# Patient Record
Sex: Male | Born: 1965 | Race: White | Hispanic: No | Marital: Married | Smoking: Never smoker
Health system: Southern US, Community
[De-identification: ages and names within clinical notes are randomized; demographics above are authoritative.]

## PROBLEM LIST (undated history)

## (undated) DIAGNOSIS — I639 Cerebral infarction, unspecified: Secondary | ICD-10-CM

## (undated) DIAGNOSIS — J45990 Exercise induced bronchospasm: Secondary | ICD-10-CM

## (undated) DIAGNOSIS — E785 Hyperlipidemia, unspecified: Secondary | ICD-10-CM

## (undated) DIAGNOSIS — Q183 Webbing of neck: Secondary | ICD-10-CM

## (undated) DIAGNOSIS — R7989 Other specified abnormal findings of blood chemistry: Secondary | ICD-10-CM

## (undated) DIAGNOSIS — M542 Cervicalgia: Secondary | ICD-10-CM

## (undated) DIAGNOSIS — E790 Hyperuricemia without signs of inflammatory arthritis and tophaceous disease: Secondary | ICD-10-CM

## (undated) DIAGNOSIS — N529 Male erectile dysfunction, unspecified: Secondary | ICD-10-CM

## (undated) HISTORY — PX: VASECTOMY: SHX75

## (undated) HISTORY — DX: Other specified abnormal findings of blood chemistry: R79.89

## (undated) HISTORY — PX: SHOULDER SURGERY: SHX246

## (undated) HISTORY — DX: Male erectile dysfunction, unspecified: N52.9

## (undated) HISTORY — DX: Webbing of neck: Q18.3

## (undated) HISTORY — PX: HERNIA REPAIR: SHX51

## (undated) HISTORY — PX: KNEE SURGERY: SHX244

## (undated) HISTORY — DX: Hyperuricemia without signs of inflammatory arthritis and tophaceous disease: E79.0

## (undated) HISTORY — DX: Cerebral infarction, unspecified: I63.9

## (undated) HISTORY — DX: Cervicalgia: M54.2

---

## 2010-11-06 ENCOUNTER — Emergency Department (HOSPITAL_COMMUNITY): Payer: Managed Care, Other (non HMO)

## 2010-11-06 ENCOUNTER — Inpatient Hospital Stay (HOSPITAL_COMMUNITY)
Admission: EM | Admit: 2010-11-06 | Discharge: 2010-11-07 | Disposition: A | Discharge status: Home / Self Care | Payer: Managed Care, Other (non HMO) | Source: Home / Self Care | Attending: Internal Medicine | Admitting: Internal Medicine

## 2010-11-06 ENCOUNTER — Inpatient Hospital Stay (HOSPITAL_COMMUNITY): Payer: Managed Care, Other (non HMO)

## 2010-11-06 DIAGNOSIS — E669 Obesity, unspecified: Secondary | ICD-10-CM | POA: Diagnosis present

## 2010-11-06 DIAGNOSIS — Z7982 Long term (current) use of aspirin: Secondary | ICD-10-CM

## 2010-11-06 DIAGNOSIS — E785 Hyperlipidemia, unspecified: Secondary | ICD-10-CM | POA: Diagnosis present

## 2010-11-06 DIAGNOSIS — N529 Male erectile dysfunction, unspecified: Secondary | ICD-10-CM | POA: Diagnosis present

## 2010-11-06 DIAGNOSIS — I7774 Dissection of vertebral artery: Secondary | ICD-10-CM | POA: Diagnosis present

## 2010-11-06 DIAGNOSIS — I6340 Cerebral infarction due to embolism of unspecified cerebral artery: Secondary | ICD-10-CM | POA: Diagnosis present

## 2010-11-06 DIAGNOSIS — I498 Other specified cardiac arrhythmias: Secondary | ICD-10-CM | POA: Diagnosis present

## 2010-11-06 LAB — URINALYSIS, ROUTINE W REFLEX MICROSCOPIC
Glucose, UA: NEGATIVE mg/dL
Nitrite: NEGATIVE
Protein, ur: NEGATIVE mg/dL
Urobilinogen, UA: 0.2 mg/dL (ref 0.0–1.0)

## 2010-11-06 LAB — BASIC METABOLIC PANEL
BUN: 18 mg/dL (ref 6–23)
Chloride: 102 mEq/L (ref 96–112)
Creatinine, Ser: 1.12 mg/dL (ref 0.4–1.5)
Glucose, Bld: 145 mg/dL — ABNORMAL HIGH (ref 70–99)

## 2010-11-06 LAB — DIFFERENTIAL
Eosinophils Absolute: 0 10*3/uL (ref 0.0–0.7)
Eosinophils Relative: 0 % (ref 0–5)
Lymphs Abs: 0.6 10*3/uL — ABNORMAL LOW (ref 0.7–4.0)
Monocytes Relative: 2 % — ABNORMAL LOW (ref 3–12)
Neutrophils Relative %: 92 % — ABNORMAL HIGH (ref 43–77)

## 2010-11-06 LAB — SEDIMENTATION RATE: Sed Rate: 5 mm/hr (ref 0–16)

## 2010-11-06 LAB — CK TOTAL AND CKMB (NOT AT ARMC)
CK, MB: 1.6 ng/mL (ref 0.3–4.0)
Relative Index: 0.9 (ref 0.0–2.5)

## 2010-11-06 LAB — CBC
HCT: 42.1 % (ref 39.0–52.0)
MCH: 30.8 pg (ref 26.0–34.0)
MCV: 85.9 fL (ref 78.0–100.0)
Platelets: 201 10*3/uL (ref 150–400)
RBC: 4.9 MIL/uL (ref 4.22–5.81)

## 2010-11-06 LAB — TROPONIN I: Troponin I: <0.3 ng/mL (ref <0.30)

## 2010-11-06 LAB — PROTIME-INR: Prothrombin Time: 14.9 seconds (ref 11.6–15.2)

## 2010-11-07 ENCOUNTER — Encounter (HOSPITAL_COMMUNITY): Payer: Self-pay | Admitting: Radiology

## 2010-11-07 ENCOUNTER — Inpatient Hospital Stay (HOSPITAL_COMMUNITY): Payer: Managed Care, Other (non HMO)

## 2010-11-07 ENCOUNTER — Inpatient Hospital Stay (HOSPITAL_COMMUNITY)
Admission: AD | Admit: 2010-11-07 | Discharge: 2010-11-09 | DRG: 064 | Disposition: A | Discharge status: Home / Self Care | Payer: Managed Care, Other (non HMO) | Source: Other Acute Inpatient Hospital | Attending: Internal Medicine | Admitting: Internal Medicine

## 2010-11-07 DIAGNOSIS — G459 Transient cerebral ischemic attack, unspecified: Secondary | ICD-10-CM

## 2010-11-07 HISTORY — DX: Exercise induced bronchospasm: J45.990

## 2010-11-07 HISTORY — DX: Hyperlipidemia, unspecified: E78.5

## 2010-11-07 LAB — BETA-2-GLYCOPROTEIN I ABS, IGG/M/A
Beta-2-Glycoprotein I IgA: 1 A Units (ref <20)
Beta-2-Glycoprotein I IgM: 1 M Units (ref <20)

## 2010-11-07 LAB — LUPUS ANTICOAGULANT PANEL
DRVVT: 48.2 secs — ABNORMAL HIGH (ref 36.2–44.3)
Lupus Anticoagulant: NOT DETECTED
PTTLA 4:1 Mix: 46.1 secs — ABNORMAL HIGH (ref 30.0–45.6)
PTTLA Confirmation: 4 secs (ref <8.0)
dRVVT Incubated 1:1 Mix: 37.7 secs (ref 36.2–44.3)

## 2010-11-07 LAB — LIPID PANEL
Total CHOL/HDL Ratio: 6.1 RATIO
VLDL: 45 mg/dL — ABNORMAL HIGH (ref 0–40)

## 2010-11-07 LAB — ANTITHROMBIN III: AntiThromb III Func: 111 % (ref 76–126)

## 2010-11-07 LAB — PROTEIN S, TOTAL: Protein S Ag, Total: 187 % — ABNORMAL HIGH (ref 60–150)

## 2010-11-07 LAB — ANA: Anti Nuclear Antibody(ANA): NEGATIVE

## 2010-11-07 MED ORDER — IOHEXOL 350 MG/ML SOLN
50.0000 mL | Freq: Once | INTRAVENOUS | Status: AC | PRN
  Administered 2010-11-07: 50 mL via INTRAVENOUS

## 2010-11-08 DIAGNOSIS — I6330 Cerebral infarction due to thrombosis of unspecified cerebral artery: Secondary | ICD-10-CM

## 2010-11-08 DIAGNOSIS — I69993 Ataxia following unspecified cerebrovascular disease: Secondary | ICD-10-CM

## 2010-11-08 LAB — GLUCOSE, CAPILLARY
Glucose-Capillary: 115 mg/dL — ABNORMAL HIGH (ref 70–99)
Glucose-Capillary: 174 mg/dL — ABNORMAL HIGH (ref 70–99)

## 2010-11-08 LAB — PROTHROMBIN GENE MUTATION

## 2010-11-09 LAB — CBC
HCT: 42.8 % (ref 39.0–52.0)
Hemoglobin: 15 g/dL (ref 13.0–17.0)
MCH: 30.8 pg (ref 26.0–34.0)
MCHC: 35 g/dL (ref 30.0–36.0)

## 2010-11-09 LAB — BASIC METABOLIC PANEL
CO2: 29 mEq/L (ref 19–32)
Calcium: 9.2 mg/dL (ref 8.4–10.5)
Chloride: 105 mEq/L (ref 96–112)
Glucose, Bld: 110 mg/dL — ABNORMAL HIGH (ref 70–99)
Sodium: 141 mEq/L (ref 135–145)

## 2010-11-09 LAB — GLUCOSE, CAPILLARY

## 2010-11-09 NOTE — Consult Note (Addendum)
NAME:  Daniel Ross, Daniel Ross            ACCOUNT NO.:  [id number]  MEDICAL RECORD NO.:  [id number]           PATIENT TYPE:  I  LOCATION:  1419                         FACILITY:  [facility]  PHYSICIAN:  Pramod P. Pearlean Brownie, MD    DATE OF BIRTH:  09/12/1965  DATE OF CONSULTATION: DATE OF DISCHARGE:                                CONSULTATION   REFERRING PHYSICIAN:  Kathlen Mody, MD  REASON FOR REFERRAL:  Stroke.  HISTORY OF PRESENT ILLNESS:  Mr. Wojtaszek is a 45 year old Caucasian gentleman who woke up this morning at 6:45 a.m. with vertigo, nausea and dizziness.  His symptoms have lasted intermittently since then.  He has improved slightly after he has come here.  He states he was fine last night till 11:30 when he went to sleep but when he woke up, he felt he had little vertigo.  He improved after few minutes but then later on he got up and started moving, noticed the vertigo was severe, he was nauseous and was off balance.  Denied any headache, loss of vision, focal extremity weakness, numbness.  There is no known prior history of stroke, TIA, or seizures.  He has no known history of any inner ear disease.  Denied of any hearing loss.  He does admit to mild left-sided posterior neck pain 2 weeks ago.  He had also dived in a baseball game and had landed on his face but is not sure whether he jerked his neck or not.  He has no known prior history of vascular disease.  He does have strong family history of premature cardiac disease in the 13s in his dad and aunt.  He takes aspirin intermittently but not daily and takes Crestor daily.  PAST SURGICAL HISTORY:  Hernia repair, knee surgery.  ALLERGIES TO MEDICATIONS:  None listed.  SOCIAL HISTORY:  Works in [employer].  He denies smoking, doing drugs or alcohol.  HOME MEDICATIONS:  Androgel daily and Crestor 1 tablet daily.  PHYSICAL EXAMINATION:  GENERAL:  A pleasant young Caucasian gentleman who is currently not in distress, afebrile. VITAL  SIGNS:  Temperature 97.7, respiratory rate 20 per minute, blood pressure is 139/68, pulse rate 66 per minute, oxygen sats 96% on room air. HEENT:  Head appears nontraumatic. NECK:  Supple.  There is no bruit.  Hearing appears normal. CARDIAC:  No murmur or gallop. LUNGS:  Clear to auscultation . ABDOMEN:  Soft, nontender. NEUROLOGIC:  The patient is awake, alert, is oriented to time, place and person.  His eye movements are full range.  I do not see any nystagmus or dysmetric saccades.  Speech is clear.  Face is symmetric.  Tongue is midline.  Motor system exam reveals no upper or lower extremity drift, symmetric and equal strength in all 4 extremities.  Finger-to-nose and interval coordination are accurate.  Gait was not tested.  Sensation and coordination appear normal.  NIH stroke scale is scored 0.  DATA REVIEWED:  Admission labs; CBC, electrolytes are normal.  MRI scan of the brain shows small patchy areas of acute infarction in the posterior inferior cerebellar artery distribution including left side of the medulla as well.  MRA of the brain showed slight decreased caliber of the terminal left vertebral artery which appears to be patent.  There is no large vessel occlusion.  Hemoglobin A1c, lipid profile, echo and Dopplers are pending at this time.  IMPRESSION:  A 45 year old gentleman with left posterior inferior cerebral artery infarct, was presented beyond time window for aggressive intervention.  Does not have significant vascular risk factors except hyperlipidemia and a strong family history of premature coronary artery disease.  He did have some neck pain and minor fall on his face 2 weeks ago raising suspicion for dissection of the left vertebral artery seems possibility.  PLAN:  I would recommend further evaluation by checking CT angiogram of brain and neck with and without contrast.  If this is negative for dissection, do transesophageal echocardiogram for cardiac  source embolism.  Check labs for vasculitis and hypercoagulable panel. Continue aspirin 325 mg a day and Crestor 20 mg a day.  Physical and Occupational Therapy consults.  Mobilize the patient out of bed.  I will be happy to follow the patient in consults and call for questions.     Pramod P. Pearlean Brownie, MD     PPS/MEDQ  D:  11/06/2010  T:  11/07/2010  Job:  [id number]  Electronically Signed by Delia Heady MD on 11/09/2010 01:51:33 PM

## 2010-11-13 NOTE — Discharge Summary (Signed)
NAME:  Daniel Ross, Daniel Ross            ACCOUNT NO.:  [id number]  MEDICAL RECORD NO.:  [id number]           PATIENT TYPE:  LOCATION:                                 FACILITY:  PHYSICIAN:  Pleas Koch, MD        DATE OF BIRTH:  Apr 27, 1966  DATE OF ADMISSION:  11/06/2010 DATE OF DISCHARGE:  11/09/2010                              DISCHARGE SUMMARY   PERTINENT CONSULTS:  Pramod P. Pearlean Brownie, MD  PRIMARY CARE PHYSICIAN:  None.  DISCHARGE DIAGNOSES: 1. 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 2. Dizziness secondary to #1. 3. Hyperlipidemia. 4. Obesity. 5. Erectile dysfunction. 6. Testosterone deficiency.  DISCHARGE MEDICATIONS: 1. AndroGel 1.62% one application daily. 2. Magnesium oxide 400 mg 1 tablet daily. 3. Nystatin over-the-counter 1 tablet daily. 4. Please note I am increasing Crestor from 10 to 20 mg daily. 5. Multivitamin 1 tablet daily. 6. Advil 200 mg 2 tablets daily p.r.n. 7. Aspirin 81 mg 1 tablet q.a.m. 8. Viagra 100 mg 1 tablet daily. 9. Cinnamon extract 1 tablet daily. 10.Calcium carbonate 500 mg 1 tablet daily. 11.Coenzyme Q10 1000 mg 1 tablet daily. 12.Temazepam 30 mg 1 tablet at bedtime. 13.Vitamin B12 1 tablet daily. 14.Vitamin E orally 400 mg 1 tablet daily.  Please see full admission dictation by Dr. Blake Divine, job number [id number]. Briefly, this is a 45 year old male with history hyperlipidemia who was brought in to the ED at [facility] for severe vertigo associated with spinning worsened with movement of head, nausea, vomiting, profuse sweating.  No chest pain, shortness breath, palpitations, tingling, numbness or generalized weakness anywhere.  No fevers, chills or cough. No abdominal pain.  He had similar complaints about a year ago he had neck cramps and had earwax symptomatic removed.  The patient has had neck cramps about 3-4 weeks ago subsequently plying softball game when he dived to catch the ball and hurt  his neck.  PHYSICAL EXAMINATION:  VITAL SIGNS:  Vitals on admission 97.7 temperature, pulse 66, respirations 18, blood pressure 139/68, satting 96% on room air. GENERAL:  He was sleeping comfortably, arousable in no acute distress. NEUROLOGIC:  He was alert, oriented x3, able to move all extremities. Neuro exam is limited secondary to vertigo.  PERTINENT IMAGING STUDIES:  MRI, MRA head done Nov 06, 2010 showed impression MRI head.  Areas of acute infarction in the inferior cerebellum on the left with posterior inferior cerebellar artery territory. No sign of hemorrhage or significant swelling.  MRA head same day showed normal intracranial MR angiogram was large medium-sized vessel including normal posterior inferior cerebellar arteries.  CT head without contrast Nov 06, 2010 showed subtle hypodensities in left cerebellum demonstrated to be acute infarct in MRI no hemorrhage or mass effect.  No new findings in this MRI.  CT angiogram of neck on Nov 08, 2010 showed short segment dissection of the left vertebral artery possibly the C1 transverse foramen.  There is some intraluminal thrombus which may cause the patient's left PICA infarct.  It may be spontaneous dissection or related to trauma.  No underlying bony abnormality.  CT head same day  was negative.  Transesophageal echocardiogram done Nov 07, 2010 showed EF of 55%-60%, wall motion no abnormalities.  Features consistent with grade 2 diastolic dysfunction.  The patient also had a TEE done on Nov 08, 2010 with normal TE with no PFO, AST trace TR, mild MR, no LAA, no LA clots.  HOSPITAL COURSE:  According to issue. 1. Cerebellar infarcts.  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  The patient had a full workup inclusive     of the above TEE.  Neurology, Dr. Marjory Lies was consulted,     recommended the patient being on Plavix due antiplatelet therapy     versus Coumadin recommended 6 months of the same.   The patient will     likely do well with therapy and we will hold his discharge until     PT/ OT can clear him.  The patient has verbalized that the     therapist has totally may not need a walker and may actual be able     to coordinate with outpatient rehab and get reconditioning gait     training.  The patient's prognosis is expected to be good. 2. Dizziness.  This is unclear to me at this point whether this is     actually secondary to his inferior cerebellar infarcts, although     certainly is possibility.  I am placing him on meclizine 12.5 daily     for about 1 month and hopefully this will help with his dizziness.     The patient will also need to be compliant on medications for the     same. 3. Bradycardia.  The patient was slightly bradycardic in hospital on     tele monitors.  It was noted that his heart rate went down to 40s     to 45.  The patient experienced less and less vomiting throughout     his stay, but interestingly he is not on any beta blockers.  This     may warrant an outpatient evaluation for the same.  4. Hyperlipidemia.  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  He had lipid panel done in     hospital which showed an LDL of 103, triglycerides 981, total     cholesterol 177, AST of 29.  Patient is compliant on a good diet     and I suspect this is more hereditary.  The patient will need to     continue on his diet. 5. Cerebrovascular accident.  The patient had a full workup including     negative cardiolipin panel, homocystine an ANA being negative.  His     hypercoagulable panel was negative.  We attribute his issue is     likely secondary to his fall when he was outstretched and trying to     play baseball and no further workup is indicated at this time. 6. Questionable diabetes mellitus.  The patient's A1c in the hospital     was 5.4.  His sugars were slightly elevated at 170s.  The patient     may be  nutritional counseling as an outpatient. 7. Testosterone deficiency and erectile dysfunction.  The patient will     continue his medications for the same.  The patient needs to be     counseled regarding the benefits and risk factor of the same     including hypotension.  Also, Testosterone has a pro-thrombotic effect      as well and patient should be counselled re: this  The patient was seen on day of discharge, blood pressure was 122-132/ 75- 79, temperature 97.5, pulse was 49-55, respirations were 18, O2 sat 96% on room air.  The patient was doing well and was able to stand up. Chest clinically clear.  S1-S2 no murmurs, rubs, or gallops.  The patient had slight pain in the left nuchal region.  He had no pallor or icterus.  Abdomen is soft, nontender. He had good power in all 4 extremities.  Although on standing he was little bit unstable.  As dictated earlier PT/OT will clear him for discharge if he does well we will send him on home with outpatient PT/OT.          ______________________________ Pleas Koch, MD     JS/MEDQ  D:  11/09/2010  T:  11/09/2010  Job:  [id number]  Electronically Signed by Pleas Koch MD on 11/13/2010 08:16:08 PM

## 2010-11-15 NOTE — H&P (Addendum)
NAME:  Daniel Ross, Daniel Ross            ACCOUNT NO.:  [id number]  MEDICAL RECORD NO.:  [id number]           PATIENT TYPE:  E  LOCATION:  WLED                         FACILITY:  [facility]  PHYSICIAN:  Kathlen Mody, MD       DATE OF BIRTH:  08/01/1965  DATE OF ADMISSION:  11/06/2010 DATE OF DISCHARGE:                             HISTORY & PHYSICAL   PRIMARY CARE PHYSICIAN:  None.  CHIEF COMPLAINT:  Dizziness.  HISTORY OF PRESENT ILLNESS:  This is a 45 year old gentleman with a history of hyperlipidemia who was brought into the ER by ambulance this morning at 7 a.m. for severe vertigo associated with spinning, worsening on movement of the head, and associated with nausea, vomiting and profuse sweating.  No complaints of chest pain, shortness of breath, syncope or palpitations.  No associated symptoms of tingling or numbness, generalized weakness or focal weakness anywhere in the body. No fever or chills.  No cough.  No abdominal pain or urinary complaints. No headaches or blurry vision.  The patient had similar complaints about a year ago with some neck stiffness, neck cramps, came into the ER had earwax removed and symptoms had resolved.  The patient did also complain of neck cramps about 4 weeks ago, which however spontaneously resolved.  REVIEW OF SYSTEMS:  See HPI, otherwise negative.  PAST MEDICAL HISTORY:  Hyperlipidemia.  PAST SURGICAL HISTORY:  Hernia repair, knee surgeries.  ALLERGIES:  No known drug allergies.  SOCIAL HISTORY:  Denies smoking, EtOH or IV drug abuse.  The patient is married.  HOME MEDICATIONS:  Crestor 1 tablet daily and AndroGel.  PHYSICAL EXAMINATION:  VITAL SIGNS:  Temperature of 97.7, pulse of 66, respiratory rate 18, blood pressure 139/68, saturating 96% on room air. GENERAL:  The patient is sleeping comfortably, arousable, in no acute distress.  HEENT:  Pupils equal and reacting to light.  Scleral icterus. Moist mucous membranes.  NECK:  No JVD.   CARDIOVASCULAR:  S1, S2 heard. No rubs, murmurs or gallops.  Regular rate and rhythm.  RESPIRATORY: Good air entry bilateral.  No wheezing or rhonchi.  ABDOMEN:  Soft, nontender, nondistended.  EXTREMITIES:  No pedal edema.  Pulses symmetrical.  NEUROLOGIC:  The patient is oriented x3, able to move all extremities.  The patient's neurological examination is limited secondary to severe vertigo.  PERTINENT LABORATORY AND X-RAY DATA:  EKG normal sinus rhythm at 65 per minute.  The patient had a CBC done which was within normal limits.  The basic metabolic panel shows sodium of 135, potassium of 4.1, chloride 102, bicarb 22, glucose of 145, BUN 18, creatinine 1.12, calcium was normal.  The patient had an MRI of the head, which shows areas of acute infarction in the inferior cerebellum on the left within the posterior inferior cerebellar artery territory, no sign of hemorrhage or swelling. MRA of the head shows normal intracranial MR angiography of the large and medium sizes vessels, normal appearance of posterior inferior cerebellar artery.  ASSESSMENT AND PLAN:  This is 45 year old gentleman with a strong family history of coronary artery disease in father at the age of 42, history of hyperlipidemia, who  comes in with severe vertigo.  MRI of the head done in the ER shows scattered areas of acute infarction within the inferior cerebellum on the left.  The patient is admitted to telemetry for inferior cerebellar stroke.  Further stroke workup will be done with a 2-D echocardiogram, carotid Dopplers, transcranial Dopplers, MRI of the neck.  Neurology consult is already called.  Speech and swallow evaluations will be done.  PT, OT consult will be called.  The patient will be started on aspirin 325 mg daily.  We will get cardiac enzymes q.6 h, a fasting lipid profile in a.m., hemoglobin A1c.  The patient will also be started on Crestor 20 mg daily.  The patient is Full Code.  Further  treatment as per the rounding MD.          ______________________________ Kathlen Mody, MD     VA/MEDQ  D:  11/06/2010  T:  11/06/2010  Job:  [id number]  Electronically Signed by Kathlen Mody MD on 11/15/2010 07:00:43 PM

## 2010-11-19 ENCOUNTER — Ambulatory Visit: Payer: Managed Care, Other (non HMO) | Attending: Neurology | Admitting: Physical Therapy

## 2010-11-19 DIAGNOSIS — I69998 Other sequelae following unspecified cerebrovascular disease: Secondary | ICD-10-CM | POA: Insufficient documentation

## 2010-11-19 DIAGNOSIS — R279 Unspecified lack of coordination: Secondary | ICD-10-CM | POA: Insufficient documentation

## 2010-11-19 DIAGNOSIS — IMO0001 Reserved for inherently not codable concepts without codable children: Secondary | ICD-10-CM | POA: Insufficient documentation

## 2010-12-05 ENCOUNTER — Ambulatory Visit: Payer: Managed Care, Other (non HMO) | Admitting: Physical Therapy

## 2011-10-21 ENCOUNTER — Encounter (HOSPITAL_COMMUNITY): Payer: Self-pay | Admitting: Emergency Medicine

## 2011-10-21 ENCOUNTER — Emergency Department (HOSPITAL_COMMUNITY)
Admission: EM | Admit: 2011-10-21 | Discharge: 2011-10-22 | Disposition: A | Discharge status: Home / Self Care | Payer: Managed Care, Other (non HMO) | Attending: Emergency Medicine | Admitting: Emergency Medicine

## 2011-10-21 DIAGNOSIS — Z79899 Other long term (current) drug therapy: Secondary | ICD-10-CM | POA: Insufficient documentation

## 2011-10-21 DIAGNOSIS — R202 Paresthesia of skin: Secondary | ICD-10-CM

## 2011-10-21 DIAGNOSIS — J45909 Unspecified asthma, uncomplicated: Secondary | ICD-10-CM | POA: Insufficient documentation

## 2011-10-21 DIAGNOSIS — R42 Dizziness and giddiness: Secondary | ICD-10-CM

## 2011-10-21 DIAGNOSIS — Z8673 Personal history of transient ischemic attack (TIA), and cerebral infarction without residual deficits: Secondary | ICD-10-CM | POA: Insufficient documentation

## 2011-10-21 DIAGNOSIS — R209 Unspecified disturbances of skin sensation: Secondary | ICD-10-CM | POA: Insufficient documentation

## 2011-10-21 DIAGNOSIS — E785 Hyperlipidemia, unspecified: Secondary | ICD-10-CM | POA: Insufficient documentation

## 2011-10-21 HISTORY — DX: Cerebral infarction, unspecified: I63.9

## 2011-10-21 NOTE — ED Provider Notes (Addendum)
History     CSN: [id number]  Arrival date & time 10/21/11  2257   First MD Initiated Contact with Patient 10/21/11 2336      Chief Complaint  Patient presents with  . Dizziness    (Consider location/radiation/quality/duration/timing/severity/associated sxs/prior treatment) HPI Comments: Patient with hx vertebral artery dissection and small embolic cerebellar CVA following diving for a ball in softball game reports he dove for a ball playing softball 4 days ago and since has a vague sensation of "not feeling right," and has had increasing episodes of stabbing pain in his right posterior neck and lightheadedness.  Today he began having tingling in his fingers, lips, and tongue.  All of these symptoms last only seconds and resolve.  He has also had decreased appetite.  Denies vertigo, CP, SOB, abdominal pain.  PCP is Dr Chrissie Noa Harvie Heck) Tiburcio Pea.    The history is provided by the patient.    Past Medical History  Diagnosis Date  . Hyperlipidemia   . Asthma, exercise induced   . Stroke     Past Surgical History  Procedure Date  . Shoulder surgery   . Hernia repair   . Knee surgery     Family History  Problem Relation Age of Onset  . Coronary artery disease Father   . Heart attack Other     History  Substance Use Topics  . Smoking status: Never Smoker   . Smokeless tobacco: Not on file  . Alcohol Use: No      Review of Systems  Constitutional: Positive for appetite change. Negative for fever.  Respiratory: Negative for cough and shortness of breath.   Cardiovascular: Negative for chest pain.  Gastrointestinal: Negative for nausea, vomiting, abdominal pain, diarrhea and constipation.  Genitourinary: Negative for dysuria.  Neurological: Negative for syncope and weakness.  All other systems reviewed and are negative.    Allergies  Review of patient's allergies indicates no known allergies.  Home Medications   Current Outpatient Rx  Name Route Sig Dispense Refill   . ASPIRIN 81 MG PO TABS Oral Take 81 mg by mouth daily.    . ATORVASTATIN CALCIUM 20 MG PO TABS Oral Take 20 mg by mouth daily.    Marland Kitchen CLOPIDOGREL BISULFATE 75 MG PO TABS Oral Take 75 mg by mouth daily.    Marland Kitchen ONE-DAILY MULTI VITAMINS PO TABS Oral Take 1 tablet by mouth daily.    . TESTOSTERONE 50 MG/5GM TD GEL Transdermal Place 5 g onto the skin daily.      BP 181/118  Pulse 55  Temp(Src) 98.5 F (36.9 C) (Oral)  Resp 20  Wt 195 lb 9.6 oz (88.724 kg)  SpO2 100%  Physical Exam  Nursing note and vitals reviewed. Constitutional: He is oriented to person, place, and time. He appears well-developed and well-nourished.  HENT:  Head: Normocephalic and atraumatic.  Neck: Neck supple.  Cardiovascular: Normal rate, regular rhythm and normal heart sounds.   Pulmonary/Chest: Breath sounds normal. No respiratory distress. He has no wheezes. He has no rales. He exhibits no tenderness.  Abdominal: Soft. Bowel sounds are normal. He exhibits no distension and no mass. There is no tenderness. There is no rebound and no guarding.  Neurological: He is alert and oriented to person, place, and time. No cranial nerve deficit or sensory deficit. He exhibits normal muscle tone. He displays a negative Romberg sign. Coordination and gait normal. GCS eye subscore is 4. GCS verbal subscore is 5. GCS motor subscore is 6.  CN II-XII intact, EOMs intact, no pronator drift, grip strengths equal bilaterally; finger to nose, heel to shin, rapid alternating movements are normal; strength 5/5 in all extremities, sensation is intact, gait is normal.     Psychiatric: He has a normal mood and affect. His behavior is normal. Judgment and thought content normal.    ED Course  Procedures (including critical care time)  Labs Reviewed  BASIC METABOLIC PANEL - Abnormal; Notable for the following:    Glucose, Bld 111 (*)    GFR calc non Af Amer 70 (*)    GFR calc Af Amer 81 (*)    All other components within normal  limits  CBC  DIFFERENTIAL  TROPONIN I   No results found.  11:59 PM Discussed patient with Dr Patria Mane.   Received call from radiologist who sees no abnormalities on CTs. Discussed with Dr Patria Mane.  Plan is for d/c home   Date: 10/22/2011  Rate: 52  Rhythm: normal sinus rhythm  QRS Axis: normal  Intervals: normal  ST/T Wave abnormalities: normal  Conduction Disutrbances:none  Narrative Interpretation:   Old EKG Reviewed: no significant changes    Filed Vitals:   10/22/11 0246  BP: 134/89  Pulse: 62  Temp:   Resp: 16   3:32 AM Discussed all results with patient and spouse.  Discussed blood pressure as well and encouraged close follow up with PCP.    1. Lightheadedness   2. Tingling       MDM  Patient with intermittent lightheadedness and bilateral tingling - symptoms lasting seconds at a time.  CT angio of head and neck are negative, labwork and physical exam unremarkable.  No neurological deficits. Pt continues to have very mild intermittent symptoms but they are not worsening and only last for seconds at a time.  Pt d/c home with PCP and neurology follow up, to return for worsening condition.  Patient verbalizes understanding and agrees with plan.         Rise Patience, PA 10/22/11 0333  Rise Patience, [place] 10/22/11 [phone]

## 2011-10-21 NOTE — ED Notes (Signed)
Pt states he has not felt well since Saturday  Pt states he has been having dizziness, pains in his neck   Pt states the dizziness has become more often today  Pt states he has hx of CVA a year ago   B/P in triage elevated  Pt denies any hx of HTN   Pt states over the weekend he was nauseated on Sunday for a couple hours  Denies headache  No neuro deficits noted at this time  Speech clear

## 2011-10-22 ENCOUNTER — Emergency Department (HOSPITAL_COMMUNITY): Payer: Managed Care, Other (non HMO)

## 2011-10-22 LAB — TROPONIN I: Troponin I: <0.3 ng/mL (ref <0.30)

## 2011-10-22 LAB — CBC
Hemoglobin: 14.3 g/dL (ref 13.0–17.0)
MCHC: 34.4 g/dL (ref 30.0–36.0)
RBC: 4.74 MIL/uL (ref 4.22–5.81)
WBC: 6.5 10*3/uL (ref 4.0–10.5)

## 2011-10-22 LAB — DIFFERENTIAL
Basophils Relative: 0 % (ref 0–1)
Lymphocytes Relative: 27 % (ref 12–46)
Monocytes Relative: 7 % (ref 3–12)
Neutro Abs: 4.2 10*3/uL (ref 1.7–7.7)
Neutrophils Relative %: 65 % (ref 43–77)

## 2011-10-22 LAB — BASIC METABOLIC PANEL
BUN: 17 mg/dL (ref 6–23)
CO2: 25 mEq/L (ref 19–32)
Chloride: 102 mEq/L (ref 96–112)
GFR calc Af Amer: 81 mL/min — ABNORMAL LOW (ref >90)
Potassium: 3.5 mEq/L (ref 3.5–5.1)

## 2011-10-22 MED ORDER — IOHEXOL 350 MG/ML SOLN: 100.0000 mL | Freq: Once | INTRAVENOUS | Status: DC | PRN

## 2011-10-22 NOTE — Discharge Instructions (Signed)
Please call your primary care provider today to schedule a close follow up appointment.  Return to the ER immediately if you develop severe lightheadedness, if you pass out, have persistent weakness or numbness on one side of your body, or develop fevers. You may return to the ER at any time for worsening condition or any new symptoms that concern you.   Dizziness Dizziness is a common problem. It is a feeling of unsteadiness or lightheadedness. You may feel like you are about to faint. Dizziness can lead to injury if you stumble or fall. A person of any age group can suffer from dizziness, but dizziness is more common in older adults. CAUSES  Dizziness can be caused by many different things, including:  Middle ear problems.   Standing for too long.   Infections.   An allergic reaction.   Aging.   An emotional response to something, such as the sight of blood.   Side effects of medicines.   Fatigue.   Problems with circulation or blood pressure.   Excess use of alcohol, medicines, or illegal drug use.   Breathing too fast (hyperventilation).   An arrhythmia or problems with your heart rhythm.   Low red blood cell count (anemia).   Pregnancy.   Vomiting, diarrhea, fever, or other illnesses that cause dehydration.   Diseases or conditions such as Parkinson's disease, high blood pressure (hypertension), diabetes, and thyroid problems.   Exposure to extreme heat.  DIAGNOSIS  To find the cause of your dizziness, your caregiver may do a physical exam, lab tests, radiologic imaging scans, or an electrocardiography test (ECG).  TREATMENT  Treatment of dizziness depends on the cause of your symptoms and can vary greatly. HOME CARE INSTRUCTIONS   Drink enough fluids to keep your urine clear or pale yellow. This is especially important in very hot weather. In the elderly, it is also important in cold weather.   If your dizziness is caused by medicines, take them exactly as  directed. When taking blood pressure medicines, it is especially important to get up slowly.   Rise slowly from chairs and steady yourself until you feel okay.   In the morning, first sit up on the side of the bed. When this seems okay, stand slowly while holding onto something until you know your balance is fine.   If you need to stand in one place for a long time, be sure to move your legs often. Tighten and relax the muscles in your legs while standing.   If dizziness continues to be a problem, have someone stay with you for a day or two. Do this until you feel you are well enough to stay alone. Have the person call your caregiver if he or she notices changes in you that are concerning.   Do not drive or use heavy machinery if you feel dizzy.  SEEK IMMEDIATE MEDICAL CARE IF:   Your dizziness or lightheadedness gets worse.   You feel nauseous or vomit.   You develop problems with talking, walking, weakness, or using your arms, hands, or legs.   You are not thinking clearly or you have difficulty forming sentences. It may take a friend or family member to determine if your thinking is normal.   You develop chest pain, abdominal pain, shortness of breath, or sweating.   Your vision changes.   You notice any bleeding.   You have side effects from medicine that seems to be getting worse rather than better.  MAKE SURE  YOU:   Understand these instructions.   Will watch your condition.   Will get help right away if you are not doing well or get worse.  Document Released: 12/17/2000 Document Revised: 06/12/2011 Document Reviewed: 01/10/2011 [employer] [facility] Patient Information 2012 [place], [place].  Paresthesia Paresthesia is an abnormal burning or prickling sensation. This sensation is generally felt in the hands, arms, legs, or feet. However, it may occur in any part of the body. It is usually not painful. The feeling may be described as:  Tingling or numbness.   "Pins and needles."     Skin crawling.   Buzzing.   Limbs "falling asleep."   Itching.  Most people experience temporary (transient) paresthesia at some time in their lives. CAUSES  Paresthesia may occur when you breathe too quickly (hyperventilation). It can also occur without any apparent cause. Commonly, paresthesia occurs when pressure is placed on a nerve. The feeling quickly goes away once the pressure is removed. For some people, however, paresthesia is a long-lasting (chronic) condition caused by an underlying disorder. The underlying disorder may be:  A traumatic, direct injury to nerves. Examples include a:   Broken (fractured) neck.   Fractured skull.   A disorder affecting the brain and spinal cord (central nervous system). Examples include:   Transverse myelitis.   Encephalitis.   Transient ischemic attack.   Multiple sclerosis.   Stroke.   Tumor or blood vessel problems, such as an arteriovenous malformation pressing against the brain or spinal cord.   A condition that damages the peripheral nerves (peripheral neuropathy). Peripheral nerves are not part of the brain and spinal cord. These conditions include:   Diabetes.   Peripheral vascular disease.   Nerve entrapment syndromes, such as carpal tunnel syndrome.   Shingles.   Hypothyroidism.   Vitamin B12 deficiencies.   Alcoholism.   Heavy metal poisoning (lead, arsenic).   Rheumatoid arthritis.   Systemic lupus erythematosus.  DIAGNOSIS  Your caregiver will attempt to find the underlying cause of your paresthesia. Your caregiver may:  Take your medical history.   Perform a physical exam.   Order various lab tests.   Order imaging tests.  TREATMENT  Treatment for paresthesia depends on the underlying cause. HOME CARE INSTRUCTIONS  Avoid drinking alcohol.   You may consider massage or acupuncture to help relieve your symptoms.   Keep all follow-up appointments as directed by your caregiver.  SEEK  IMMEDIATE MEDICAL CARE IF:   You feel weak.   You have trouble walking or moving.   You have problems with speech or vision.   You feel confused.   You cannot control your bladder or bowel movements.   You feel numbness after an injury.   You faint.   Your burning or prickling feeling gets worse when walking.   You have pain, cramps, or dizziness.   You develop a rash.  MAKE SURE YOU:  Understand these instructions.   Will watch your condition.   Will get help right away if you are not doing well or get worse.  Document Released: 06/13/2002 Document Revised: 06/12/2011 Document Reviewed: 03/14/2011 [facility] Patient Information 2012 [place], [place].

## 2011-10-22 NOTE — ED Provider Notes (Signed)
Medical screening examination/treatment/procedure(s) were performed by non-physician practitioner and as supervising physician I was immediately available for consultation/collaboration.   Lyanne Co, MD 10/22/11 [phone]

## 2011-10-22 NOTE — ED Provider Notes (Signed)
Medical screening examination/treatment/procedure(s) were performed by non-physician practitioner and as supervising physician I was immediately available for consultation/collaboration.   Cniyah Sproull M Sheriff Rodenberg, MD 10/22/11 0704 

## 2011-10-22 NOTE — ED Notes (Signed)
Pt. Discharged to home pt. Alert and oriented, ambulatory gait steady

## 2014-01-05 DIAGNOSIS — I639 Cerebral infarction, unspecified: Secondary | ICD-10-CM | POA: Insufficient documentation

## 2014-01-05 DIAGNOSIS — E785 Hyperlipidemia, unspecified: Secondary | ICD-10-CM | POA: Insufficient documentation

## 2014-01-05 DIAGNOSIS — J45990 Exercise induced bronchospasm: Secondary | ICD-10-CM | POA: Insufficient documentation

## 2014-02-16 ENCOUNTER — Encounter: Payer: Self-pay | Admitting: *Deleted

## 2015-07-18 ENCOUNTER — Telehealth: Payer: Self-pay | Admitting: Neurology

## 2015-07-18 NOTE — Telephone Encounter (Signed)
Rn call patient back about needing neuro clearance. Pt does not have any current office notes in epic. Pt stated Dr.Sethi release him to see his PCP for his plavix refills and management. Rn stated if he needs neuro clearance to come off the plavix, he would need another referral to [facility], Rn advised patient to call his PCP for a referral to [facility] to get cleared by a MD. Pt verbalized understanding.

## 2015-07-18 NOTE — Telephone Encounter (Signed)
Pt called today and will have surgery Feb. 3rd. He saw Dr. Pearlean BrownieSethi here a few years ago and was prescribed a blood thinner. He was told by his pcp to call our office and see if it is ok to come off it. Pt also saw Dr. Pearlean BrownieSethi while in the hospital 11/07/10. Please call and advise [phone]

## 2018-08-09 DIAGNOSIS — R739 Hyperglycemia, unspecified: Secondary | ICD-10-CM | POA: Diagnosis not present

## 2018-08-09 DIAGNOSIS — I10 Essential (primary) hypertension: Secondary | ICD-10-CM | POA: Diagnosis not present

## 2018-08-09 DIAGNOSIS — E291 Testicular hypofunction: Secondary | ICD-10-CM | POA: Diagnosis not present

## 2018-08-09 DIAGNOSIS — E782 Mixed hyperlipidemia: Secondary | ICD-10-CM | POA: Diagnosis not present

## 2020-03-23 ENCOUNTER — Encounter: Payer: Self-pay | Admitting: Cardiovascular Disease

## 2020-04-05 ENCOUNTER — Other Ambulatory Visit: Payer: Self-pay

## 2020-04-05 ENCOUNTER — Encounter: Payer: Self-pay | Admitting: Cardiovascular Disease

## 2020-04-05 ENCOUNTER — Ambulatory Visit (INDEPENDENT_AMBULATORY_CARE_PROVIDER_SITE_OTHER): Payer: Managed Care, Other (non HMO) | Admitting: Cardiovascular Disease

## 2020-04-05 VITALS — BP 122/78 | HR 61 | Temp 97.3°F | Ht 72.0 in | Wt 205.0 lb

## 2020-04-05 DIAGNOSIS — Z8249 Family history of ischemic heart disease and other diseases of the circulatory system: Secondary | ICD-10-CM

## 2020-04-05 DIAGNOSIS — E785 Hyperlipidemia, unspecified: Secondary | ICD-10-CM | POA: Diagnosis not present

## 2020-04-05 DIAGNOSIS — N183 Chronic kidney disease, stage 3 unspecified: Secondary | ICD-10-CM

## 2020-04-05 DIAGNOSIS — G060 Intracranial abscess and granuloma: Secondary | ICD-10-CM

## 2020-04-05 DIAGNOSIS — Z5181 Encounter for therapeutic drug level monitoring: Secondary | ICD-10-CM

## 2020-04-05 DIAGNOSIS — N1831 Chronic kidney disease, stage 3a: Secondary | ICD-10-CM | POA: Diagnosis not present

## 2020-04-05 HISTORY — DX: Family history of ischemic heart disease and other diseases of the circulatory system: Z82.49

## 2020-04-05 HISTORY — DX: Intracranial abscess and granuloma: G06.0

## 2020-04-05 HISTORY — DX: Chronic kidney disease, stage 3 unspecified: N18.30

## 2020-04-05 NOTE — Progress Notes (Signed)
Cardiology Office Note   Date:  04/05/2020   ID:  Daniel Ross, DOB 1965/10/03, MRN [id number]  PCP:  [name], MD  Cardiologist:   Chilton Si, MD   No chief complaint on file.   History of Present Illness: .Daniel Ross is a 54 y.o. male with hypertension, hyperlipidemia, CKD 3, prior stroke, and gout who is being seen today for the evaluation of cardiovascular risk assessment at the request of [name], MD.  He has a couple friends who had heart attacks who were seemingly healthy.  He also has a strong family history of cardiovascular disease.  Therefore he decided to come get a risk assessment.  He had a stroke in 2012 that was thought to be due to an injury.  He was playing sports and felt a cramp in his neck.  He later developed stroke-like symptoms.  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  Since then he has been on clopidogrel and atorvastatin.  Lately he has been feeling well.  He has not been able to get much exercise due to pain in his right knee.  He just got an injection in hopes to be walking again soon.  He does ride his bike at times but does not feel like he gets as much cardio that way.  He denies any exertional chest pain or shortness of breath.  He sometimes has an imprint from his socks but no true edema.  He denies orthopnea or PND.  He notes that he struggles with his diet and does not like many vegetables.  He has been trying to eat more salads.  He has vertigo but otherwise has minimal orthostatic symptoms when standing.  He denies any syncope or presyncope.  He was recently started on amlodipine and his blood pressure has been better controlled since that time.  He had cough with ACE inhibitor's and this is improved once he was switched to losartan.  He does still have mild coughing at times.  However he is not bothered by it.   Past Medical History:  Diagnosis Date  . Asthma, exercise induced   .  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 Cerebellar stroke (HCC)   . CKD (chronic kidney disease) stage 3, GFR 30-59 ml/min 04/05/2020  . ED (erectile dysfunction)   . Elevated serum creatinine   . Elevated uric acid in blood   . Family history of premature CAD 04/05/2020  . Hyperlipidemia   . Neck pain   . Stroke (HCC)   . Transient mild hyperphenylalaninemia   . Webbed neck     Past Surgical History:  Procedure Laterality Date  . HERNIA REPAIR    . KNEE SURGERY    . SHOULDER SURGERY    . VASECTOMY       Current Outpatient Medications  Medication Sig Dispense Refill  . allopurinol (ZYLOPRIM) 100 MG tablet Take 100 mg by mouth daily.    Marland Kitchen amLODipine (NORVASC) 10 MG tablet Take 10 mg by mouth daily.    Marland Kitchen atorvastatin (LIPITOR) 40 MG tablet Take 40 mg by mouth daily.    . clopidogrel (PLAVIX) 75 MG tablet Take 75 mg by mouth daily.    . famotidine-calcium carbonate-magnesium hydroxide (PEPCID COMPLETE) 10-800-165 MG CHEW chewable tablet Chew 1 tablet by mouth daily as needed.    Marland Kitchen LORazepam (ATIVAN) 0.5 MG tablet Take 0.5 mg by mouth every 8 (eight) hours.    Marland Kitchen losartan (COZAAR) 50 MG tablet  Take 50 mg by mouth 2 (two) times daily.    . sildenafil (VIAGRA) 100 MG tablet Take 100 mg by mouth daily as needed for erectile dysfunction.    Marland Kitchen testosterone (ANDROGEL) 50 MG/5GM (1%) GEL Place 5 g onto the skin daily.    . Testosterone 20.25 MG/ACT (1.62%) GEL SMARTSIG:2 Pump T-DERMAL Daily    . valACYclovir (VALTREX) 1000 MG tablet Take 2,000 mg by mouth 2 (two) times daily.     No current facility-administered medications for this visit.    Allergies:   Ciprofloxacin    Social History:  The patient  reports that he has never smoked. He has never used smokeless tobacco. He reports that he does not drink alcohol and does not use drugs.   Family History:  The patient's family history includes Coronary artery disease in his father; Heart attack in his maternal grandfather and another  family member; Heart attack (age of onset: 25) in his paternal grandfather; Hyperlipidemia in his father; Hypertension in his maternal grandmother, mother, and sister; Stroke in his mother.    ROS:  Please see the history of present illness.   Otherwise, review of systems are positive for none.   All other systems are reviewed and negative.    PHYSICAL EXAM: VS:  BP 122/78   Pulse 61   Temp (!) 97.3 F (36.3 C)   Ht 6' (1.829 m)   Wt 205 lb (93 kg)   SpO2 96%   BMI 27.80 kg/m  , BMI Body mass index is 27.8 kg/m. GENERAL:  Well appearing HEENT:  Pupils equal round and reactive, fundi not visualized, oral mucosa unremarkable NECK:  No jugular venous distention, waveform within normal limits, carotid upstroke brisk and symmetric, no bruits, no thyromegaly LYMPHATICS:  No cervical adenopathy LUNGS:  Clear to auscultation bilaterally HEART:  RRR.  PMI not displaced or sustained,S1 and S2 within normal limits, no S3, no S4, no clicks, no rubs, no murmurs ABD:  Flat, positive bowel sounds normal in frequency in pitch, no bruits, no rebound, no guarding, no midline pulsatile mass, no hepatomegaly, no splenomegaly EXT:  2 plus pulses throughout, no edema, no cyanosis no clubbing SKIN:  No rashes no nodules NEURO:  Cranial nerves II through XII grossly intact, motor grossly intact throughout PSYCH:  Cognitively intact, oriented to person place and time   EKG:  EKG is ordered today. The ekg ordered today demonstrates sinus rhythm.  Rate 61 bpm.   Recent Labs: No results found for requested labs within last 8760 hours.   02/22/2020: Total cholesterol 154, triglycerides 137, HDL 36, LDL 93  Lipid Panel    Component Value Date/Time   CHOL 177 11/07/2010 0525   TRIG 223 (H) 11/07/2010 0525   HDL 29 (L) 11/07/2010 0525   CHOLHDL 6.1 11/07/2010 0525   VLDL 45 (H) 11/07/2010 0525   LDLCALC (H) 11/07/2010 0525    103        Total Cholesterol/HDL:CHD Risk Coronary Heart Disease Risk  Table                     Men   Women  1/2 Average Risk   3.4   3.3  Average Risk       5.0   4.4  2 X Average Risk   9.6   7.1  3 X Average Risk  23.4   11.0        Use the calculated Patient Ratio above and the CHD Risk  Table to determine the patient's CHD Risk.        ATP III CLASSIFICATION (LDL):  <100     mg/dL   Optimal  983-382  mg/dL   Near or Above                    Optimal  130-159  mg/dL   Borderline  505-397  mg/dL   High  >673     mg/dL   Very High      Wt Readings from Last 3 Encounters:  04/05/20 205 lb (93 kg)  10/21/11 195 lb 9.6 oz (88.7 kg)      ASSESSMENT AND PLAN:  # CV Risk assessment:  # Pure Hypercholesterolemia: # Prior stroke: Daniel Ross does not have any ischemic symptoms.  He is not as active lately but is able to ride a bike without any chest pain or shortness of breath.  Therefore is very unlikely that he has he has any obstructive coronary disease.  He is already on aggressive risk factor modification with clopidogrel and atorvastatin.  We did discuss the fact that ideally his LDL should be less than 70.  We discussed considering stress testing.  However this would not be indicated as he has no ischemic symptoms.  I noted that coronary calcium scoring would not change his management given that he is already on clopidogrel and atorvastatin.  He would like to pursue a coronary calcium score to better understand his underlying anatomy.  We will arrange this.  He is going to work on increasing his exercise back up to 150 minutes weekly and limiting fried foods, fatty foods, and cheeses.  We will repeat lipids and a CMP in 3 months.  If his LDL remains above goal we will titrate his statin at that time.   Current medicines are reviewed at length with the patient today.  The patient does not have concerns regarding medicines.  The following changes have been made:  no change  Labs/ tests ordered today include:   Orders Placed This Encounter   Procedures  . CT CARDIAC SCORING  . Lipid panel  . Comprehensive metabolic panel  . EKG 12-Lead     Disposition:   FU with Tully Burgo C. Duke Salvia, MD, [facility] virtually in 3 months.     Signed, Crisanto Nied C. Duke Salvia, MD, [facility]  04/05/2020 9:30 AM    [facility]

## 2020-04-05 NOTE — Patient Instructions (Addendum)
Medication Instructions:  Your physician recommends that you continue on your current medications as directed. Please refer to the Current Medication list given to you today.  *If you need a refill on your cardiac medications before your next appointment, please call your pharmacy*  Lab Work: FASTING LP/CMET IN 3  MONTHS   If you have labs (blood work) drawn today and your tests are completely normal, you will receive your results only by: Marland Kitchen MyChart Message (if you have MyChart) OR . A paper copy in the mail If you have any lab test that is abnormal or we need to change your treatment, we will call you to review the results.  Testing/Procedures: CALCIUM SCORE, THIS WILL COST YOU $150 OUT OF POCKET [facility] AT [address]   Follow-Up: At [facility], you and your health needs are our priority.  As part of our continuing mission to provide you with exceptional heart care, we have created designated Provider Care Teams.  These Care Teams include your primary Cardiologist (physician) and Advanced Practice Providers (APPs -  Physician Assistants and Nurse Practitioners) who all work together to provide you with the care you need, when you need it.  We recommend signing up for the patient portal called "MyChart".  Sign up information is provided on this After Visit Summary.  MyChart is used to connect with patients for Virtual Visits (Telemedicine).  Patients are able to view lab/test results, encounter notes, upcoming appointments, etc.  Non-urgent messages can be sent to your provider as well.   To learn more about what you can do with MyChart, go to [email or web address].    Your next appointment:   3 month(s) AFTER YOUR LAB WORK   The format for your next appointment:   Virtual Visit   Provider:   You may see [facility]  or one of the following Advanced Practice Providers on your designated Care Team:    Corine Shelter, PA-C  [place], [place]  Edd Fabian, [place]  Other Instructions  TRY TO GET 150 MINUTES OF EXERCISE EACH WEEK    DECREASE YOUR FRIED/FATTY FOODS, CHEESES, AND GRAVIES

## 2020-04-23 ENCOUNTER — Other Ambulatory Visit: Payer: Self-pay

## 2020-04-23 ENCOUNTER — Ambulatory Visit (INDEPENDENT_AMBULATORY_CARE_PROVIDER_SITE_OTHER)
Admission: RE | Admit: 2020-04-23 | Discharge: 2020-04-23 | Disposition: A | Discharge status: Home / Self Care | Payer: Self-pay | Source: Ambulatory Visit | Attending: Cardiovascular Disease | Admitting: Cardiovascular Disease

## 2020-04-23 DIAGNOSIS — Z8249 Family history of ischemic heart disease and other diseases of the circulatory system: Secondary | ICD-10-CM

## 2020-04-23 DIAGNOSIS — E785 Hyperlipidemia, unspecified: Secondary | ICD-10-CM

## 2020-04-25 ENCOUNTER — Telehealth: Payer: Self-pay | Admitting: Cardiovascular Disease

## 2020-04-25 NOTE — Telephone Encounter (Signed)
    Pt returning Melinda's call to get CT result

## 2020-04-25 NOTE — Telephone Encounter (Signed)
Left message to call back  

## 2020-04-25 NOTE — Telephone Encounter (Signed)
-----   Message from Daniel Si, MD sent at 04/23/2020  4:45 PM EDT ----- CT showed some calcium in the coronary arteries which is fairly mild but we need to make sure it does not progress over time.  Continue with goal of getting his LDL under 75 using a statin.  150 minutes of exercise weekly.  Limit fried foods, fatty foods, red meat, and cheese.

## 2020-04-25 NOTE — Telephone Encounter (Signed)
Patient returning Melinda's phone call, connected call to [place].

## 2020-04-25 NOTE — Telephone Encounter (Signed)
Advised patient, verbalized understanding  

## 2020-05-07 ENCOUNTER — Ambulatory Visit: Payer: Managed Care, Other (non HMO) | Admitting: Internal Medicine

## 2020-06-27 ENCOUNTER — Encounter: Payer: Self-pay | Admitting: Cardiovascular Disease

## 2020-06-27 ENCOUNTER — Telehealth (INDEPENDENT_AMBULATORY_CARE_PROVIDER_SITE_OTHER): Payer: Managed Care, Other (non HMO) | Admitting: Cardiovascular Disease

## 2020-06-27 VITALS — BP 116/79 | HR 67 | Ht 72.0 in | Wt 200.0 lb

## 2020-06-27 DIAGNOSIS — I10 Essential (primary) hypertension: Secondary | ICD-10-CM | POA: Diagnosis not present

## 2020-06-27 DIAGNOSIS — I639 Cerebral infarction, unspecified: Secondary | ICD-10-CM | POA: Diagnosis not present

## 2020-06-27 DIAGNOSIS — E7800 Pure hypercholesterolemia, unspecified: Secondary | ICD-10-CM

## 2020-06-27 DIAGNOSIS — I2510 Atherosclerotic heart disease of native coronary artery without angina pectoris: Secondary | ICD-10-CM

## 2020-06-27 HISTORY — DX: Atherosclerotic heart disease of native coronary artery without angina pectoris: I25.10

## 2020-06-27 HISTORY — DX: Essential (primary) hypertension: I10

## 2020-06-27 NOTE — Patient Instructions (Signed)
Medication Instructions:  Your physician recommends that you continue on your current medications as directed. Please refer to the Current Medication list given to you today.  *If you need a refill on your cardiac medications before your next appointment, please call your pharmacy*  Lab Work: LP/CMET SOON   If you have labs (blood work) drawn today and your tests are completely normal, you will receive your results only by: Marland Kitchen MyChart Message (if you have MyChart) OR . A paper copy in the mail If you have any lab test that is abnormal or we need to change your treatment, we will call you to review the results.  Testing/Procedures: NONE   Follow-Up: At [facility], you and your health needs are our priority.  As part of our continuing mission to provide you with exceptional heart care, we have created designated Provider Care Teams.  These Care Teams include your primary Cardiologist (physician) and Advanced Practice Providers (APPs -  Physician Assistants and Nurse Practitioners) who all work together to provide you with the care you need, when you need it.  We recommend signing up for the patient portal called "MyChart".  Sign up information is provided on this After Visit Summary.  MyChart is used to connect with patients for Virtual Visits (Telemedicine).  Patients are able to view lab/test results, encounter notes, upcoming appointments, etc.  Non-urgent messages can be sent to your provider as well.   To learn more about what you can do with MyChart, go to [email or web address].    Your next appointment:   12 month(s) You will receive a reminder letter in the mail two months in advance. If you don't receive a letter, please call our office to schedule the follow-up appointment.  The format for your next appointment:   In Person  Provider:   You may see Chilton Si, MD or one of the following Advanced Practice Providers on your designated Care Team:    Corine Shelter,  PA-C  [place], PA-C  Edd Fabian, FNP  TRY TO  EXERCISE 150 MINUTES EACH WEEK

## 2020-06-27 NOTE — Progress Notes (Signed)
Virtual Visit via Telephone Note   This visit type was conducted due to national recommendations for restrictions regarding the COVID-19 Pandemic (e.g. social distancing) in an effort to limit this patient's exposure and mitigate transmission in our community.  Due to his co-morbid illnesses, this patient is at least at moderate risk for complications without adequate follow up.  This format is felt to be most appropriate for this patient at this time.  The patient did not have access to video technology/had technical difficulties with video requiring transitioning to audio format only (telephone).  All issues noted in this document were discussed and addressed.  No physical exam could be performed with this format.  Please refer to the patient's chart for his  consent to telehealth for [facility].   The patient was identified using 2 identifiers.  Date:  06/27/2020   ID:  Daniel Ross, DOB 19-Dec-1965, MRN [id number]  Patient Location: Home Provider Location: Office/Clinic  PCP:  Daniel Blamer, MD  Cardiologist:  Daniel Si, MD  Electrophysiologist:  None   Evaluation Performed:  Follow-Up Visit  Chief Complaint:  Hyperlipidemia, CAD  History of Present Illness:     Daniel Ross is a 54 y.o. male with non-obstructive CAD, hypertension, hyperlipidemia, CKD 3, prior stroke, and gout here for follow up.  He was initially seen 03/2020 for the evaluation of cardiovascular risk assessment.  He has a couple friends who had heart attacks who were seemingly healthy.  He also has a strong family history of cardiovascular disease.  Therefore he decided to come get a risk assessment.  He had a stroke in 2012 that was thought to be due to an injury.  He was playing sports and felt a cramp in his neck.  He later developed stroke-like symptoms.  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  Since then he has been on clopidogrel and atorvastatin.  Lately  he has been feeling well.  He has not been able to get much exercise due to pain in his right knee.  He just got an injection in hopes to be walking again soon.  He does ride his bike at times but does not feel like he gets as much cardio that way.  He denies any exertional chest pain or shortness of breath.  He sometimes has an imprint from his socks but no true edema.  He denies orthopnea or PND.  He notes that he struggles with his diet and does not like many vegetables.  He has been trying to eat more salads.  He has vertigo but otherwise has minimal orthostatic symptoms when standing.  He denies any syncope or presyncope.  He was recently started on amlodipine and his blood pressure has been better controlled since that time.  He had cough with ACE inhibitor's and this is improved once he was switched to losartan.  He does still have mild coughing at times.  However he is not bothered by it.  After his last appointment Daniel Ross was referred for a coronary calcium score.  He had a score of 46, which was 69th percentile.  He had mostly mid LAD calcification. He has been under a lot of stress at work lately.  He hasn't been getting as much exercise but plans to start back soon.  He has struggled with knee pain.  He had gel shots that haven't been helpful. He plans to start training on his bike more.  When he does exercise he  has no chest pain or shortness of breath.  He has been working on his diet and lost some weight until his work stress increased.  He is cutting back on cheese.  The patient does not have symptoms concerning for COVID-19 infection (fever, chills, cough, or new shortness of breath).   Past Medical History:  Diagnosis Date  . Asthma, exercise induced   . CAD in native artery 06/27/2020   Coronary calcium score 69th percentile in 2021  . 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 Cerebellar stroke (HCC)   . CKD (chronic kidney disease) stage 3, GFR 30-59 ml/min (HCC) 04/05/2020  . ED  (erectile dysfunction)   . Elevated serum creatinine   . Elevated uric acid in blood   . Essential hypertension 06/27/2020  . Family history of premature CAD 04/05/2020  . Hyperlipidemia   . Neck pain   . Stroke (HCC)   . Transient mild hyperphenylalaninemia   . Webbed neck     Past Surgical History:  Procedure Laterality Date  . HERNIA REPAIR    . KNEE SURGERY    . SHOULDER SURGERY    . VASECTOMY       Current Outpatient Medications  Medication Sig Dispense Refill  . allopurinol (ZYLOPRIM) 100 MG tablet Take 100 mg by mouth daily.    Marland Kitchen amLODipine (NORVASC) 10 MG tablet Take 10 mg by mouth daily.    Marland Kitchen atorvastatin (LIPITOR) 40 MG tablet Take 40 mg by mouth daily.    . clopidogrel (PLAVIX) 75 MG tablet Take 75 mg by mouth daily.    . famotidine-calcium carbonate-magnesium hydroxide (PEPCID COMPLETE) 10-800-165 MG chewable tablet Chew 1 tablet by mouth daily as needed.    Marland Kitchen LORazepam (ATIVAN) 0.5 MG tablet Take 0.5 mg by mouth at bedtime as needed.    Marland Kitchen losartan (COZAAR) 50 MG tablet Take 100 mg by mouth daily.    . sildenafil (VIAGRA) 100 MG tablet Take 100 mg by mouth daily as needed for erectile dysfunction.    Marland Kitchen testosterone (ANDROGEL) 50 MG/5GM (1%) GEL Place 5 g onto the skin daily.    . valACYclovir (VALTREX) 1000 MG tablet Take 2,000 mg by mouth 2 (two) times daily as needed.     No current facility-administered medications for this visit.    Allergies:   Ciprofloxacin    Social History:  The patient  reports that he has never smoked. He has never used smokeless tobacco. He reports that he does not drink alcohol and does not use drugs.   Family History:  The patient's family history includes Coronary artery disease in his father; Heart attack in his maternal grandfather and another family member; Heart attack (age of onset: 48) in his paternal grandfather; Hyperlipidemia in his father; Hypertension in his maternal grandmother, mother, and sister; Stroke in his mother.     ROS:  Please see the history of present illness.   Otherwise, review of systems are positive for none.   All other systems are reviewed and negative.    PHYSICAL EXAM: VS:  BP 116/79   Pulse 67   Ht 6' (1.829 m)   Wt 200 lb (90.7 kg)   BMI 27.12 kg/m  , BMI Body mass index is 27.12 kg/m. GENERAL:  Well appearing HEENT: Pupils equal round and reactive, fundi not visualized, oral mucosa unremarkable NECK:  No jugular venous distention, waveform within normal limits, carotid upstroke brisk and symmetric, no bruits LUNGS:  Clear to auscultation bilaterally HEART:  RRR.  PMI not  displaced or sustained,S1 and S2 within normal limits, no S3, no S4, no clicks, no rubs, no murmurs ABD:  Flat, positive bowel sounds normal in frequency in pitch, no bruits, no rebound, no guarding, no midline pulsatile mass, no hepatomegaly, no splenomegaly EXT:  2 plus pulses throughout, no edema, no cyanosis no clubbing SKIN:  No rashes no nodules NEURO:  Cranial nerves II through XII grossly intact, motor grossly intact throughout PSYCH:  Cognitively intact, oriented to person place and time   EKG:  EKG is not ordered today. The ekg ordered 04/05/20 demonstrates sinus rhythm.  Rate 61 bpm.  Coronary calcium score: IMPRESSION: Coronary calcium score of 46. This was 69th percentile for age and sex matched control.  Daniel Si, MD  Recent Labs: No results found for requested labs within last 8760 hours.   02/22/2020: Total cholesterol 154, triglycerides 137, HDL 36, LDL 93  Lipid Panel    Component Value Date/Time   CHOL 177 11/07/2010 0525   TRIG 223 (H) 11/07/2010 0525   HDL 29 (L) 11/07/2010 0525   CHOLHDL 6.1 11/07/2010 0525   VLDL 45 (H) 11/07/2010 0525   LDLCALC (H) 11/07/2010 0525    103        Total Cholesterol/HDL:CHD Risk Coronary Heart Disease Risk Table                     Men   Women  1/2 Average Risk   3.4   3.3  Average Risk       5.0   4.4  2 X Average Risk   9.6    7.1  3 X Average Risk  23.4   11.0        Use the calculated Patient Ratio above and the CHD Risk Table to determine the patient's CHD Risk.        ATP III CLASSIFICATION (LDL):  <100     mg/dL   Optimal  175-102  mg/dL   Near or Above                    Optimal  130-159  mg/dL   Borderline  585-277  mg/dL   High  >824     mg/dL   Very High      Wt Readings from Last 3 Encounters:  06/27/20 200 lb (90.7 kg)  04/05/20 205 lb (93 kg)  10/21/11 195 lb 9.6 oz (88.7 kg)      ASSESSMENT AND PLAN:  # Coronary calcification:  # Pure Hypercholesterolemia: # Prior stroke: Mr. Dollard does not have any ischemic symptoms.  He is not as active lately but is able to ride a bike without any chest pain or shortness of breath. Calcium score 69th percentile.  We discussed LDL goal <70.  Continue the atorvastatin and return for fasting lipids/CMP.  Continue clopidogrel which he is on after his stroke.  He will keep working on diet and exercise.  Goal is 150 minutes weekly.  # Hypertension:  BP well-controlled.  Continue amlodipine and losartan.   COVID-19 Education: The signs and symptoms of COVID-19 were discussed with the patient and how to seek care for testing (follow up with PCP or arrange E-visit).  The importance of social distancing was discussed today.  Time:   Today, I have spent 17 minutes with the patient with telehealth technology discussing the above problems.   Current medicines are reviewed at length with the patient today.  The patient does not have concerns  regarding medicines.  The following changes have been made:  no change  Labs/ tests ordered today include:   No orders of the defined types were placed in this encounter.    Disposition:   FU with Jennalynn Rivard C. Duke Salviaandolph, MD, [facility] virtually in 3 months.     Signed, Lajeana Strough C. Duke Salviaandolph, MD, [facility]  06/27/2020 9:50 AM    [facility]

## 2020-07-12 ENCOUNTER — Ambulatory Visit: Payer: Managed Care, Other (non HMO) | Admitting: Cardiovascular Disease

## 2021-11-13 IMAGING — CT CT CARDIAC CORONARY ARTERY CALCIUM SCORE
3 series · 14 of 20 positions shown, 15 images · non-contrast
Comparison: None.
COMPARISON: None.

Addendum:
EXAM:
OVER-READ INTERPRETATION  CT CHEST

The following report is an over-read performed by radiologist Dr.
Abo Sham Zain [REDACTED] on 04/23/2020. This
over-read does not include interpretation of cardiac or coronary
anatomy or pathology. The coronary calcium score interpretation by
the cardiologist is attached.
CLINICAL DATA: 54M with hypertension, hyperlipidemia, prior stroke,
and CKD 3 for risk stratification
Coronary Calcium Score
TECHNIQUE: The patient was scanned on a Siemens Force scanner. Axial
non-contrast 3 mm slices were carried out through the heart. The
data set was analyzed on a dedicated work station and scored using
the Agatson method.

[Series 2: casc 3.0 bv41 2 bestdiast 67 % · axial · 0.39mm/px · z∈[-236,-164]mm · 4 of 42 slices shown, 5 images]
[im 9/42  vessel]
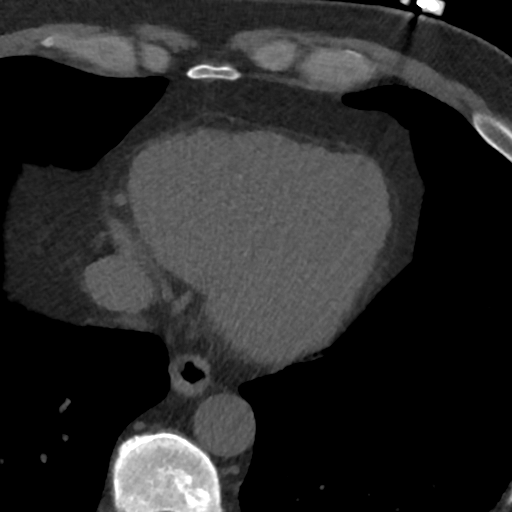
[im 9/42  lung]
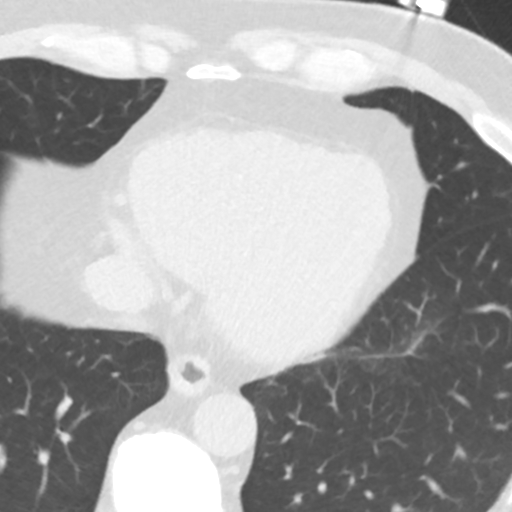
[im 17/42  vessel]
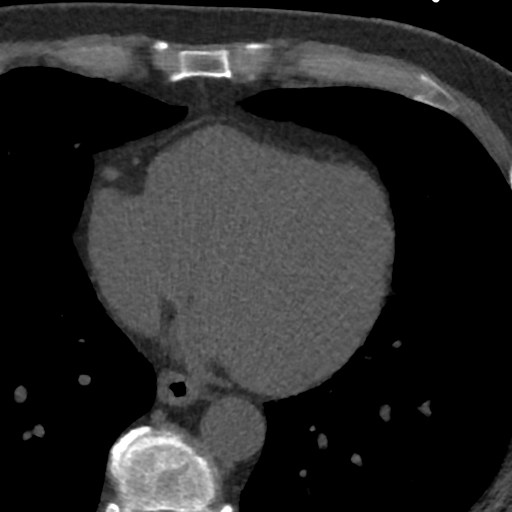
[im 25/42  vessel]
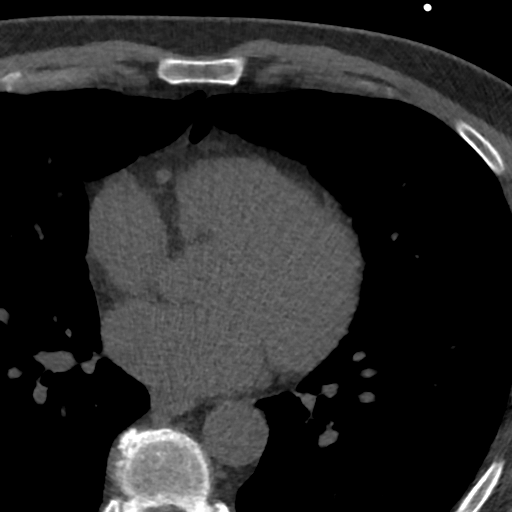
[im 33/42  vessel]
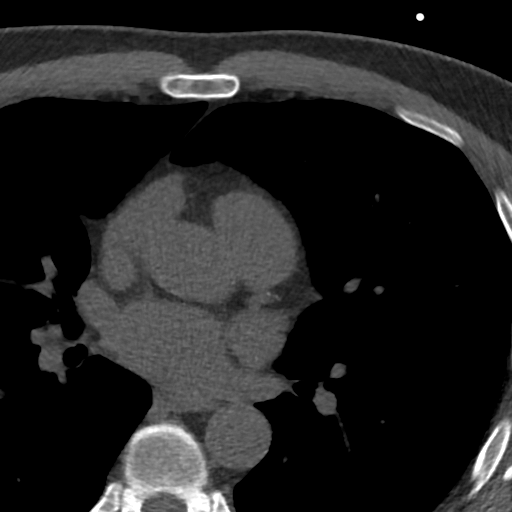

[Series 3: lung 68 % · axial · 0.72mm/px · z∈[-242,-158]mm · 5 of 42 slices shown]
[im 7/42  lung]
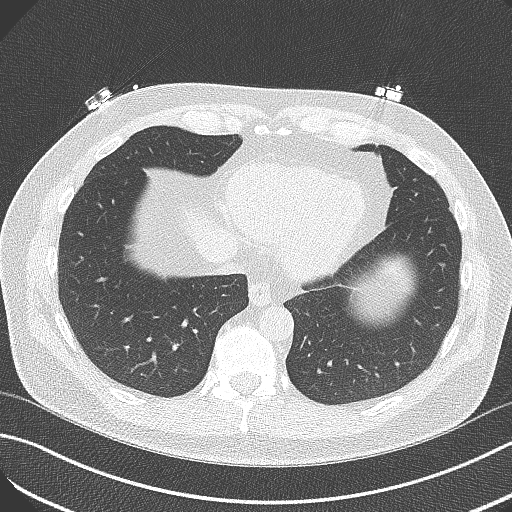
[im 14/42  lung]
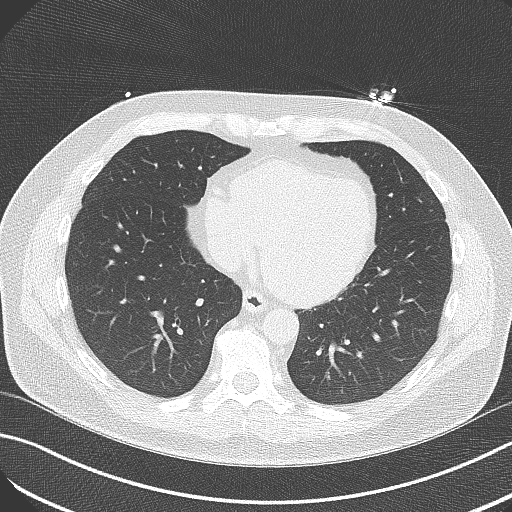
[im 21/42  lung]
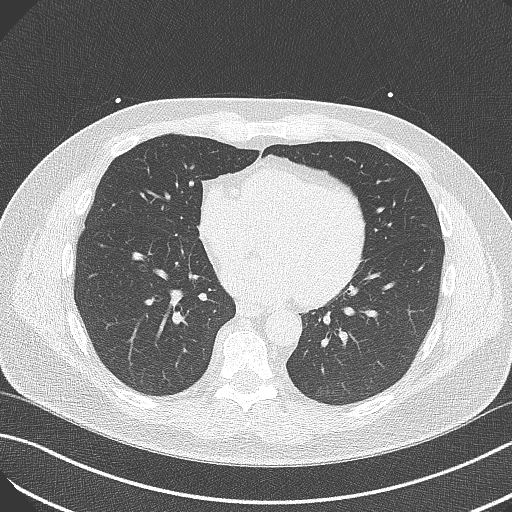
[im 28/42  lung]
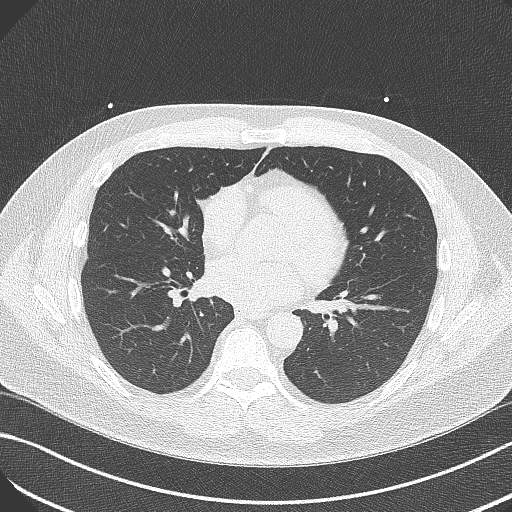
[im 35/42  lung]
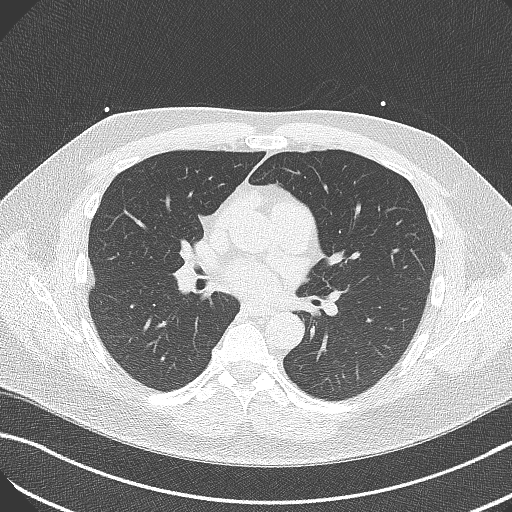

[Series 4: lung st 68 % · axial · 0.72mm/px · z∈[-242,-158]mm · 5 of 42 slices shown]
[im 7/42  lung]
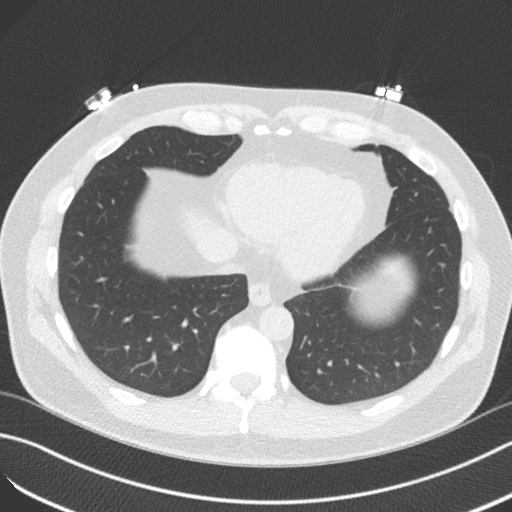
[im 14/42  lung]
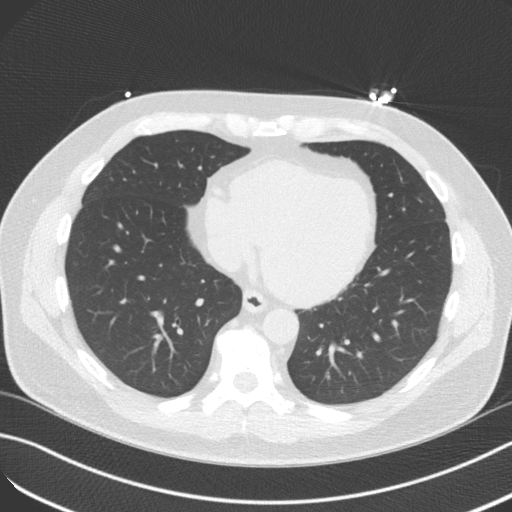
[im 21/42  lung]
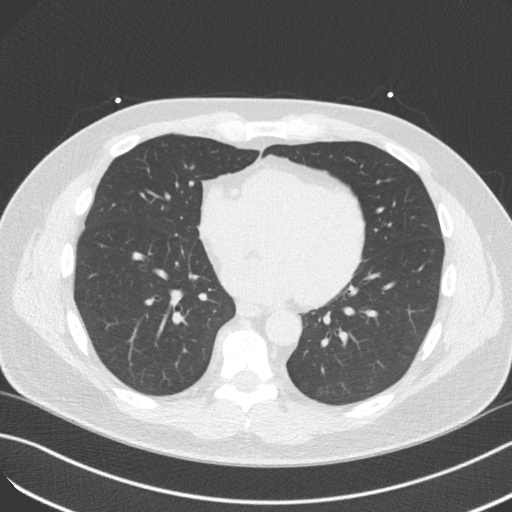
[im 28/42  lung]
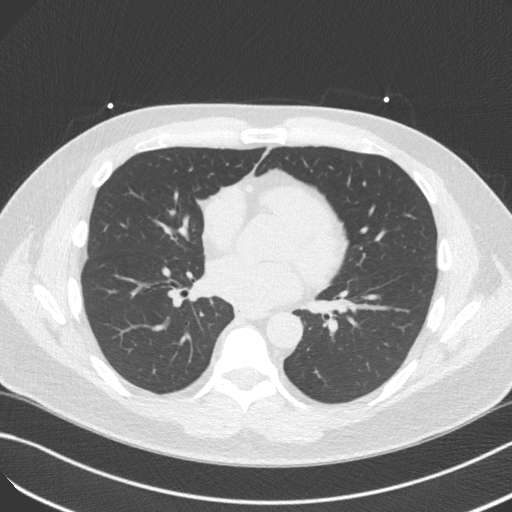
[im 35/42  lung]
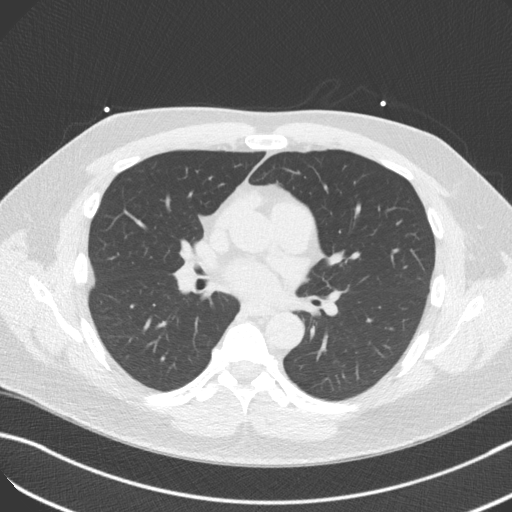

[14 of 20 positions shown; findings below may reference images not displayed]

FINDINGS: Limited view of the lung parenchyma demonstrates no suspicious
nodularity. Airways are normal.

Limited view of the mediastinum demonstrates no adenopathy.
Esophagus normal.

Limited view of the upper abdomen unremarkable.

Limited view of the skeleton and chest wall is unremarkable.
IMPRESSION: No significant extracardiac findings.
FINDINGS: Non-cardiac: See separate report from [REDACTED].

Ascending Aorta: Normal size.  No calcification.

Pericardium: Normal

Coronary arteries: Normal origins. Right dominant. Scant
calcification in the LAD and RCA.
IMPRESSION: Coronary calcium score of 46. This was 69th percentile for age and
sex matched control.

*** End of Addendum ***
EXAM:
OVER-READ INTERPRETATION  CT CHEST

The following report is an over-read performed by radiologist Dr.
Abo Sham Zain [REDACTED] on 04/23/2020. This
over-read does not include interpretation of cardiac or coronary
anatomy or pathology. The coronary calcium score interpretation by
the cardiologist is attached.
FINDINGS: Limited view of the lung parenchyma demonstrates no suspicious
nodularity. Airways are normal.

Limited view of the mediastinum demonstrates no adenopathy.
Esophagus normal.

Limited view of the upper abdomen unremarkable.

Limited view of the skeleton and chest wall is unremarkable.
IMPRESSION: No significant extracardiac findings.

## 2022-07-14 ENCOUNTER — Ambulatory Visit
Admission: RE | Admit: 2022-07-14 | Discharge: 2022-07-14 | Disposition: A | Discharge status: Home / Self Care | Payer: BC Managed Care – PPO | Source: Ambulatory Visit | Attending: Family Medicine | Admitting: Family Medicine

## 2022-07-14 ENCOUNTER — Other Ambulatory Visit: Payer: Self-pay | Admitting: Family Medicine

## 2022-07-14 DIAGNOSIS — R0602 Shortness of breath: Secondary | ICD-10-CM

## 2022-07-25 ENCOUNTER — Encounter: Payer: Self-pay | Admitting: Internal Medicine

## 2022-07-25 ENCOUNTER — Ambulatory Visit: Payer: BC Managed Care – PPO | Admitting: Internal Medicine

## 2022-07-25 VITALS — BP 144/80 | HR 69 | Ht 72.0 in | Wt 211.8 lb

## 2022-07-25 DIAGNOSIS — R0609 Other forms of dyspnea: Secondary | ICD-10-CM

## 2022-07-25 DIAGNOSIS — I639 Cerebral infarction, unspecified: Secondary | ICD-10-CM

## 2022-07-25 DIAGNOSIS — I10 Essential (primary) hypertension: Secondary | ICD-10-CM

## 2022-07-25 DIAGNOSIS — R0602 Shortness of breath: Secondary | ICD-10-CM

## 2022-07-25 NOTE — Progress Notes (Signed)
Primary Physician/Referring:  [name], MD  Patient ID: Daniel Ross, male    DOB: May 08, 1966, 57 y.o.   MRN: [id number]  Chief Complaint  Patient presents with   Shortness of Breath   New Patient (Initial Visit)   family history of heart disease   HPI:    Daniel Ross  is a 57 y.o. male with past medical history significant for hypertension, hyperlipidemia, and stroke in 2012 without residual deficits who is here to establish care with cardiology.  Patient has been having shortness of breath and dyspnea on exertion since August 2023 and it has been getting worse lately.  He does not have any symptoms when he is walking but when he started trying to work out again he developed severe shortness of breath and had to stop and it took a while for him to recover.  The other night he did about 10 push-ups but this caused significant shortness of breath and the patient had to stop and catch his breath.  Prior to August, he was not having the symptoms with exertion.  He is concerned given his family history of early heart disease.  Patient denies palpitations, diaphoresis, syncope, edema, orthopnea, PND, claudication.  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   Cerebellar stroke (HCC)    CKD (chronic kidney disease) stage 3, GFR 30-59 ml/min (HCC) 04/05/2020   ED (erectile dysfunction)    Elevated serum creatinine    Elevated uric acid in blood    Essential hypertension 06/27/2020   Family history of premature CAD 04/05/2020   Hyperlipidemia    Neck pain    Stroke (HCC)    Transient mild hyperphenylalaninemia    Webbed neck    Past Surgical History:  Procedure Laterality Date   HERNIA REPAIR     KNEE SURGERY     SHOULDER SURGERY     VASECTOMY     Family History  Problem Relation Age of Onset   Hypertension Mother     Stroke Mother    Coronary artery disease Father        late 52s   Hyperlipidemia Father    Hypertension Sister    Heart attack Brother    Hypertension Maternal Grandmother    Heart attack Maternal Grandfather    Heart attack Paternal Grandfather 60   Heart attack Other     Social History   Tobacco Use   Smoking status: Never   Smokeless tobacco: Never  Substance Use Topics   Alcohol use: No   Marital Status: Married  ROS  Review of Systems  Cardiovascular:  Positive for dyspnea on exertion.  Respiratory:  Positive for shortness of breath.    Objective  Blood pressure (!) 144/80, pulse 69, height 6' (1.829 m), weight 211 lb 12.8 oz (96.1 kg), SpO2 98 %. Body mass index is 28.73 kg/m.     07/25/2022    2:12 PM 06/27/2020    9:10 AM 04/05/2020    8:29 AM  Vitals with BMI  Height 6\' 0"  6\' 0"  6\' 0"   Weight 211 lbs 13 oz 200 lbs 205 lbs  BMI 28.72 27.12 27.8  Systolic 144 116  Diastolic 80 79 78  Pulse 69 67 61     Physical Exam Vitals reviewed.  HENT:     Head: Normocephalic and atraumatic.  Cardiovascular:  Rate and Rhythm: Normal rate and regular rhythm.     Pulses: Normal pulses.     Heart sounds: Normal heart sounds. No murmur heard. Pulmonary:     Effort: Pulmonary effort is normal.     Breath sounds: Normal breath sounds.  Abdominal:     General: Bowel sounds are normal.  Musculoskeletal:     Right lower leg: No edema.     Left lower leg: No edema.  Skin:    General: Skin is warm and dry.  Neurological:     Mental Status: He is alert.     Medications and allergies   Allergies  Allergen Reactions   Ciprofloxacin     Dizziness      Medication list after today's encounter   Current Outpatient Medications:    allopurinol (ZYLOPRIM) 100 MG tablet, Take 100 mg by mouth daily., Disp: , Rfl:    amLODipine (NORVASC) 10 MG tablet, Take 10 mg by mouth daily., Disp: , Rfl:    atorvastatin (LIPITOR) 40 MG tablet, Take 40 mg by mouth daily., Disp:  , Rfl:    clopidogrel (PLAVIX) 75 MG tablet, Take 75 mg by mouth daily., Disp: , Rfl:    famotidine-calcium carbonate-magnesium hydroxide (PEPCID COMPLETE) 10-800-165 MG chewable tablet, Chew 1 tablet by mouth daily as needed., Disp: , Rfl:    LORazepam (ATIVAN) 0.5 MG tablet, Take 0.5 mg by mouth at bedtime as needed., Disp: , Rfl:    losartan (COZAAR) 50 MG tablet, Take 100 mg by mouth daily., Disp: , Rfl:    sildenafil (VIAGRA) 100 MG tablet, Take 100 mg by mouth daily as needed for erectile dysfunction., Disp: , Rfl:    Testosterone (ANDROGEL) 25 DU/2.0UR (1%) GEL, 2 applications to skin in the morning Transdermal Once a day, Disp: , Rfl:    valACYclovir (VALTREX) 1000 MG tablet, Take 2,000 mg by mouth 2 (two) times daily as needed., Disp: , Rfl:   Laboratory examination:   Lab Results  Component Value Date   NA 138 10/22/2011   K 3.5 10/22/2011   CO2 25 10/22/2011   GLUCOSE 111 (H) 10/22/2011   BUN 17 10/22/2011   CREATININE 1.22 10/22/2011   CALCIUM 9.1 10/22/2011   GFRNONAA 70 (L) 10/22/2011       Latest Ref Rng & Units 10/22/2011   12:00 AM 11/09/2010    7:19 AM 11/06/2010    2:23 PM  CMP  Glucose 70 - 99 mg/dL 111  110  145   BUN 6 - 23 mg/dL 17  14  18    Creatinine 0.50 - 1.35 mg/dL 1.22  1.26  1.12   Sodium 135 - 145 mEq/L 138  141  135   Potassium 3.5 - 5.1 mEq/L 3.5  3.6  4.1   Chloride 96 - 112 mEq/L 102  105  102   CO2 19 - 32 mEq/L 25  29  22    Calcium 8.4 - 10.5 mg/dL 9.1  9.2  9.2       Latest Ref Rng & Units 10/22/2011   12:00 AM 11/09/2010    7:19 AM 11/06/2010    2:23 PM  CBC  WBC 4.0 - 10.5 K/uL 6.5  7.4  10.2   Hemoglobin 13.0 - 17.0 g/dL 14.3  15.0  15.1   Hematocrit 39.0 - 52.0 % 41.6  42.8  42.1   Platelets 150 - 400 K/uL 193  184  201     Lipid Panel No results for input(s): "CHOL", "TRIG", "LDLCALC", "  VLDL", "HDL", "CHOLHDL", "LDLDIRECT" in the last 8760 hours.  HEMOGLOBIN A1C Lab Results  Component Value Date   HGBA1C  11/07/2010     5.4 (NOTE)                                                                       According to the ADA Clinical Practice Recommendations for 2011, when HbA1c is used as a screening test:   >=6.5%   Diagnostic of Diabetes Mellitus           (if abnormal result  is confirmed)  5.7-6.4%   Increased risk of developing Diabetes Mellitus  References:Diagnosis and Classification of Diabetes Mellitus,Diabetes [id number],7829,56(OZHYQ 1):S62-S69 and Standards of Medical Care in         Diabetes - 2011,Diabetes [id number],4696,29  (Suppl 1):S11-S61.   MPG 108 11/07/2010   TSH No results for input(s): "TSH" in the last 8760 hours.  External labs:   Labs 03/07/2022: Total cholesterol 163 triglycerides 144 HDL 37 LDL 101 Glucose 119 BUN 17 creatinine 1.32 total bili 1.5 potassium 4.0 Uric acid 5.7 Hemoglobin 13.2 hematocrit 38.9 platelets 305   Radiology:    Cardiac Studies:     EKG:   07/25/2022: Sinus Rhythm, normal axis, no evidence of ischemia  Assessment     ICD-10-CM   1. SOB (shortness of breath)  R06.02 EKG 12-Lead    PCV ECHOCARDIOGRAM COMPLETE    PCV MYOCARDIAL PERFUSION WO LEXISCAN    2. Essential hypertension  I10 PCV ECHOCARDIOGRAM COMPLETE    PCV MYOCARDIAL PERFUSION WO LEXISCAN    3. Cerebrovascular accident (CVA), unspecified mechanism ([place])  I63.9 PCV ECHOCARDIOGRAM COMPLETE    PCV MYOCARDIAL PERFUSION WO LEXISCAN    4. DOE (dyspnea on exertion)  R06.09        Orders Placed This Encounter  Procedures   PCV MYOCARDIAL PERFUSION WO LEXISCAN    Standing Status:   Future    Standing Expiration Date:   09/23/2022   EKG 12-Lead   PCV ECHOCARDIOGRAM COMPLETE    Standing Status:   Future    Standing Expiration Date:   07/26/2023    No orders of the defined types were placed in this encounter.   Medications Discontinued During This Encounter  Medication Reason   testosterone (ANDROGEL) 50 BM/8UX (1%) GEL Duplicate     Recommendations:   Daniel Ross is a 57 y.o.  with  DOE  SOB (shortness of breath) + DOE (dyspnea on exertion) EKG without evidence of ischemia Nuclear stress test and echo ordered Continue with ASA and Plavix, Crestor Patient instructed not to do heavy lifting, heavy exertional activity, swimming until evaluation is complete.  Patient instructed to call if symptoms worse or to go to the ED for further evaluation.   Essential hypertension Continue current cardiac medications. BP slightly elevated because patient is nervous Encourage low-sodium diet, less than 2000 mg daily.   Cerebrovascular accident (CVA), unspecified mechanism ([address]) No residual deficits from stroke in 2012 Continue taking Plavix per primary     Floydene Flock, DO, [facility]  07/25/2022, 2:55 PM Office: [phone] Pager: [phone]

## 2022-07-28 NOTE — Telephone Encounter (Signed)
Yes just don't over do it

## 2022-07-28 NOTE — Telephone Encounter (Signed)
From patient

## 2022-07-30 ENCOUNTER — Other Ambulatory Visit: Payer: BC Managed Care – PPO

## 2022-08-01 ENCOUNTER — Ambulatory Visit: Payer: BC Managed Care – PPO

## 2022-08-01 DIAGNOSIS — I10 Essential (primary) hypertension: Secondary | ICD-10-CM

## 2022-08-01 DIAGNOSIS — I639 Cerebral infarction, unspecified: Secondary | ICD-10-CM

## 2022-08-01 DIAGNOSIS — R0602 Shortness of breath: Secondary | ICD-10-CM

## 2022-08-02 NOTE — Progress Notes (Signed)
normal

## 2022-08-04 NOTE — Progress Notes (Signed)
Called patient no answer, left a vm

## 2022-08-05 ENCOUNTER — Ambulatory Visit: Payer: BC Managed Care – PPO

## 2022-08-05 DIAGNOSIS — R0602 Shortness of breath: Secondary | ICD-10-CM

## 2022-08-05 DIAGNOSIS — I10 Essential (primary) hypertension: Secondary | ICD-10-CM

## 2022-08-05 DIAGNOSIS — I639 Cerebral infarction, unspecified: Secondary | ICD-10-CM

## 2022-08-05 NOTE — Progress Notes (Signed)
Patient is aware

## 2022-08-08 NOTE — Progress Notes (Signed)
Gave patient results, he acknowledged understanding and had no further questions.

## 2022-08-08 NOTE — Progress Notes (Signed)
normal

## 2022-09-05 ENCOUNTER — Ambulatory Visit: Payer: BC Managed Care – PPO | Admitting: Internal Medicine

## 2022-09-17 ENCOUNTER — Ambulatory Visit: Payer: BC Managed Care – PPO | Admitting: Internal Medicine

## 2022-09-17 ENCOUNTER — Encounter: Payer: Self-pay | Admitting: Internal Medicine

## 2022-09-17 VITALS — BP 123/80 | HR 70 | Ht 72.0 in | Wt 206.8 lb

## 2022-09-17 DIAGNOSIS — I639 Cerebral infarction, unspecified: Secondary | ICD-10-CM

## 2022-09-17 DIAGNOSIS — E782 Mixed hyperlipidemia: Secondary | ICD-10-CM

## 2022-09-17 DIAGNOSIS — I10 Essential (primary) hypertension: Secondary | ICD-10-CM

## 2022-09-17 NOTE — Progress Notes (Signed)
Primary Physician/Referring:  Shirline Frees, MD  Patient ID: Daniel Ross, male    DOB: [birth year], 57 y.o.   MRN: [id number]  Chief Complaint  Patient presents with  . Shortness of Breath  . Follow-up  . Results   HPI:    Daniel Ross  is a 57 y.o. male with past medical history significant for hypertension, hyperlipidemia, and stroke in 2012 without residual deficits who is here to establish care with cardiology.  Patient has been having shortness of breath and dyspnea on exertion since August 2023 and it has been getting worse lately.  He does not have any symptoms when he is walking but when he started trying to work out again he developed severe shortness of breath and had to stop and it took a while for him to recover.  The other night he did about 10 push-ups but this caused significant shortness of breath and the patient had to stop and catch his breath.  Prior to August, he was not having the symptoms with exertion.  He is concerned given his family history of early heart disease.  Patient denies palpitations, diaphoresis, syncope, edema, orthopnea, PND, claudication.  Past Medical History:  Diagnosis Date  . Asthma, exercise induced   . CAD in native artery 06/27/2020   Coronary calcium score 69th percentile in 2021  . Cerebellar abscess (embolic) [id number]  . Cerebellar stroke ([place])   . CKD (chronic kidney disease) stage 3, GFR 30-59 ml/min (HCC) 04/05/2020  . ED (erectile dysfunction)   . Elevated serum creatinine   . Elevated uric acid in blood   . Essential hypertension 06/27/2020  . Family history of premature CAD 04/05/2020  . Hyperlipidemia   . Neck pain   . Stroke ([place])   . Transient mild hyperphenylalaninemia   . Webbed neck    Past Surgical History:  Procedure Laterality Date  . HERNIA REPAIR    . KNEE SURGERY    . SHOULDER SURGERY    . VASECTOMY     Family History  Problem Relation Age of Onset  . Hypertension Mother   . Stroke Mother   .  Coronary artery disease Father        late 24s  . Hyperlipidemia Father   . Hypertension Sister   . Heart attack Brother   . Hypertension Maternal Grandmother   . Heart attack Maternal Grandfather   . Heart attack Paternal Grandfather 18  . Heart attack Other     Social History   Tobacco Use  . Smoking status: Never  . Smokeless tobacco: Never  Substance Use Topics  . Alcohol use: No   Marital Status: Married  ROS  Review of Systems  Cardiovascular:  Positive for dyspnea on exertion.  Respiratory:  Positive for shortness of breath.    Objective  Blood pressure 123/80, pulse 70, height 6' (1.829 m), weight 206 lb 12.8 oz (93.8 kg), SpO2 96 %. Body mass index is 28.05 kg/m.     09/17/2022    1:16 PM 07/25/2022    2:12 PM 06/27/2020    9:10 AM  Vitals with BMI  Height '6\' 0"'$  '6\' 0"'$  '6\' 0"'$   Weight 206 lbs 13 oz 211 lbs 13 oz 200 lbs  BMI 28.04 [id number]  Systolic [id number]  Diastolic 80 80 79  Pulse 70 69 67     Physical Exam Vitals reviewed.  HENT:     Head: Normocephalic and atraumatic.  Cardiovascular:     Rate and Rhythm:  Normal rate and regular rhythm.     Pulses: Normal pulses.     Heart sounds: Normal heart sounds. No murmur heard. Pulmonary:     Effort: Pulmonary effort is normal.     Breath sounds: Normal breath sounds.  Abdominal:     General: Bowel sounds are normal.  Musculoskeletal:     Right lower leg: No edema.     Left lower leg: No edema.  Skin:    General: Skin is warm and dry.  Neurological:     Mental Status: He is alert.    Medications and allergies   Allergies  Allergen Reactions  . Ciprofloxacin     Dizziness      Medication list after today's encounter   Current Outpatient Medications:  .  allopurinol (ZYLOPRIM) 100 MG tablet, Take 100 mg by mouth daily., Disp: , Rfl:  .  amLODipine (NORVASC) 10 MG tablet, Take 10 mg by mouth daily., Disp: , Rfl:  .  atorvastatin (LIPITOR) 40 MG tablet, Take 40 mg by mouth daily., Disp: ,  Rfl:  .  clopidogrel (PLAVIX) 75 MG tablet, Take 75 mg by mouth daily., Disp: , Rfl:  .  famotidine-calcium carbonate-magnesium hydroxide (PEPCID COMPLETE) 10-800-165 MG chewable tablet, Chew 1 tablet by mouth daily as needed., Disp: , Rfl:  .  LORazepam (ATIVAN) 0.5 MG tablet, Take 0.5 mg by mouth at bedtime as needed., Disp: , Rfl:  .  losartan (COZAAR) 50 MG tablet, Take 100 mg by mouth daily., Disp: , Rfl:  .  sildenafil (VIAGRA) 100 MG tablet, Take 100 mg by mouth daily as needed for erectile dysfunction., Disp: , Rfl:  .  Testosterone (ANDROGEL) 25 123XX123 (1%) GEL, 2 applications to skin in the morning Transdermal Once a day, Disp: , Rfl:  .  Testosterone 20.25 MG/1.25GM (1.62%) GEL, APPLY 2 PUMPS (1 PUMP TO EACH SHOULDER) TRANSDERMALLY ONCE A DAY 30 DAYS, Disp: , Rfl:  .  valACYclovir (VALTREX) 1000 MG tablet, Take 2,000 mg by mouth 2 (two) times daily as needed., Disp: , Rfl:   Laboratory examination:   Lab Results  Component Value Date   NA 138 10/22/2011   K 3.5 10/22/2011   CO2 25 10/22/2011   GLUCOSE 111 (H) 10/22/2011   BUN 17 10/22/2011   CREATININE 1.22 10/22/2011   CALCIUM 9.1 10/22/2011   GFRNONAA 70 (L) 10/22/2011       Latest Ref Rng & Units 10/22/2011   12:00 AM 11/09/2010    7:19 AM 11/06/2010    2:23 PM  CMP  Glucose 70 - 99 mg/dL 111  110  145   BUN 6 - 23 mg/dL '17  14  18   '$ Creatinine 0.50 - 1.35 mg/dL 1.22  1.26  1.12   Sodium 135 - 145 mEq/L 138  141  135   Potassium 3.5 - 5.1 mEq/L 3.5  3.6  4.1   Chloride 96 - 112 mEq/L 102  105  102   CO2 19 - 32 mEq/L '25  29  22   '$ Calcium 8.4 - 10.5 mg/dL 9.1  9.2  9.2       Latest Ref Rng & Units 10/22/2011   12:00 AM 11/09/2010    7:19 AM 11/06/2010    2:23 PM  CBC  WBC 4.0 - 10.5 K/uL 6.5  7.4  10.2   Hemoglobin 13.0 - 17.0 g/dL 14.3  15.0  15.1   Hematocrit 39.0 - 52.0 % 41.6  42.8  42.1   Platelets 150 -  400 K/uL 193  184  201     Lipid Panel No results for input(s): "CHOL", "TRIG", "LDLCALC", "VLDL",  "HDL", "CHOLHDL", "LDLDIRECT" in the last 8760 hours.  HEMOGLOBIN A1C Lab Results  Component Value Date   HGBA1C  11/07/2010    5.4 (NOTE)                                                                       According to the ADA Clinical Practice Recommendations for 2011, when HbA1c is used as a screening test:   >=6.5%   Diagnostic of Diabetes Mellitus           (if abnormal result  is confirmed)  5.7-6.4%   Increased risk of developing Diabetes Mellitus  References:Diagnosis and Classification of Diabetes Mellitus,Diabetes [id number] 1):S62-S69 and Standards of Medical Care in         Diabetes - 2011,Diabetes [id number]  (Suppl 1):S11-S61.   MPG 108 11/07/2010   TSH No results for input(s): "TSH" in the last 8760 hours.  External labs:   Labs 03/07/2022: Total cholesterol 163 triglycerides 144 HDL 37 LDL 101 Glucose 119 BUN 17 creatinine 1.32 total bili 1.5 potassium 4.0 Uric acid 5.7 Hemoglobin 13.2 hematocrit 38.9 platelets 305   Radiology:    Cardiac Studies:     EKG:   07/25/2022: Sinus Rhythm, normal axis, no evidence of ischemia  Assessment   No diagnosis found.    No orders of the defined types were placed in this encounter.   No orders of the defined types were placed in this encounter.   There are no discontinued medications.    Recommendations:   Zayyan Trimarco is a 56 y.o.  with DOE  SOB (shortness of breath) + DOE (dyspnea on exertion) EKG without evidence of ischemia Nuclear stress test and echo ordered Continue with ASA and Plavix, Crestor Patient instructed not to do heavy lifting, heavy exertional activity, swimming until evaluation is complete.  Patient instructed to call if symptoms worse or to go to the ED for further evaluation.   Essential hypertension Continue current cardiac medications. BP slightly elevated because patient is nervous Encourage low-sodium diet, less than 2000 mg daily.   Cerebrovascular accident  (CVA), unspecified mechanism ([place]) No residual deficits from stroke in 2012 Continue taking Plavix per primary     Floydene Flock, DO, [facility]  09/17/2022, 1:31 PM Office: [phone] Pager: [phone]

## 2022-10-02 ENCOUNTER — Encounter: Payer: Self-pay | Admitting: Cardiology

## 2022-10-02 DIAGNOSIS — Z006 Encounter for examination for normal comparison and control in clinical research program: Secondary | ICD-10-CM | POA: Insufficient documentation

## 2022-10-02 NOTE — Progress Notes (Signed)
Physical Exam Vitals and nursing note reviewed.  Constitutional:      General: He is not in acute distress. Neck:     Vascular: No JVD.  Cardiovascular:     Rate and Rhythm: Normal rate and regular rhythm.     Heart sounds: Normal heart sounds. No murmur heard. Pulmonary:     Effort: Pulmonary effort is normal.     Breath sounds: Normal breath sounds. No wheezing or rales.  Abdominal:     Tenderness: There is no abdominal tenderness.  Musculoskeletal:     Right lower leg: No edema.     Left lower leg: No edema.  Neurological:     General: No focal deficit present.     Mental Status: He is oriented to person, place, and time.

## 2023-03-20 ENCOUNTER — Ambulatory Visit: Payer: BC Managed Care – PPO | Admitting: Cardiology

## 2023-03-20 ENCOUNTER — Encounter: Payer: Self-pay | Admitting: Cardiology

## 2023-03-20 VITALS — BP 137/81 | HR 64 | Resp 16 | Ht 72.0 in | Wt 207.0 lb

## 2023-03-20 DIAGNOSIS — I10 Essential (primary) hypertension: Secondary | ICD-10-CM

## 2023-03-20 DIAGNOSIS — R931 Abnormal findings on diagnostic imaging of heart and coronary circulation: Secondary | ICD-10-CM | POA: Insufficient documentation

## 2023-03-20 DIAGNOSIS — E782 Mixed hyperlipidemia: Secondary | ICD-10-CM

## 2023-03-20 NOTE — Progress Notes (Signed)
Follow up visit  Subjective:   Daniel Ross, male    DOB: [birth year], 57 y.o.   MRN: [id number]    HPI  Chief Complaint  Patient presents with   Hypertension    57 y.o. Caucasian old male with hypertension, hyperlipidemia, elevated coronary calcium score, h/o stroke  No symptoms of chest pain or dyspnea at this time.  He is trying to increase physical activity.  Blood pressure is well-controlled.  He had visit with PCP, where labs were drawn.    Current Outpatient Medications:    allopurinol (ZYLOPRIM) 100 MG tablet, Take 100 mg by mouth daily., Disp: , Rfl:    amLODipine (NORVASC) 10 MG tablet, Take 10 mg by mouth daily., Disp: , Rfl:    atorvastatin (LIPITOR) 40 MG tablet, Take 40 mg by mouth daily., Disp: , Rfl:    clopidogrel (PLAVIX) 75 MG tablet, Take 75 mg by mouth daily., Disp: , Rfl:    famotidine-calcium carbonate-magnesium hydroxide (PEPCID COMPLETE) 10-800-165 MG chewable tablet, Chew 1 tablet by mouth daily as needed., Disp: , Rfl:    LORazepam (ATIVAN) 0.5 MG tablet, Take 0.5 mg by mouth at bedtime as needed., Disp: , Rfl:    losartan (COZAAR) 50 MG tablet, Take 100 mg by mouth daily., Disp: , Rfl:    sildenafil (VIAGRA) 100 MG tablet, Take 100 mg by mouth daily as needed for erectile dysfunction., Disp: , Rfl:    Testosterone (ANDROGEL) 25 MG/2.5GM (1%) GEL, 2 applications to skin in the morning Transdermal Once a day, Disp: , Rfl:    Testosterone 20.25 MG/1.25GM (1.62%) GEL, APPLY 2 PUMPS (1 PUMP TO EACH SHOULDER) TRANSDERMALLY ONCE A DAY 30 DAYS, Disp: , Rfl:    valACYclovir (VALTREX) 1000 MG tablet, Take 2,000 mg by mouth 2 (two) times daily as needed., Disp: , Rfl:    Cardiovascular & other pertient studies:  Reviewed external labs and tests, independently interpreted  EKG 04/06/2023: Sinus rhythm 63 bpm Nonspecific T wave abnormality  Echocardiogram 08/01/2022:  Normal LV systolic function with visual EF 60-65%. Left ventricle cavity  is normal in  size. Normal left ventricular wall thickness. Normal global  wall motion. Normal diastolic filling pattern, normal LAP. Calculated EF  68%.  Structurally normal mitral valve.  Mild (Grade I) mitral regurgitation.  Structurally normal tricuspid valve.  Mild tricuspid regurgitation. No  evidence of pulmonary hypertension.  Structurally normal pulmonic valve.  Mild to moderate pulmonic  regurgitation.  No prior available for comparison.   Exercise nuclear stress test 08/05/2022: Myocardial perfusion is normal. Overall LV systolic function is normal without regional wall motion abnormalities. Stress LV EF: 58%. Low risk study. Normal ECG stress. The patient exercised for 5 minutes and 58 seconds of a Bruce protocol, achieving approximately 7.05 METs and 88% MPHR. The heart rate response was normal. The blood pressure response was normal. No previous exam available for comparison.  CT cardiac scoring 04/2020: Coronary arteries: Normal origins. Right dominant. Scant calcification in the LAD and RCA.   IMPRESSION: Coronary calcium score of 46. This was 69th percentile for age and sex matched control.  Recent labs: 09/2021-09/2022: Glucose 254, BUN/Cr 16/1.3. EGFR 63. K 4.2 Hb 13.2 HbA1C 6.4% Chol 163, TG 144, HDL 37, LDL 101 TSH 1.3 normal    Review of Systems  Cardiovascular:  Negative for chest pain, dyspnea on exertion, leg swelling, palpitations and syncope.         Vitals:   03/20/23 1047  BP: 137/81  Pulse: 64  Resp:  16  SpO2: 97%    Body mass index is 28.07 kg/m. Filed Weights   03/20/23 1047  Weight: 207 lb (93.9 kg)     Objective:   Physical Exam Vitals and nursing note reviewed.  Constitutional:      General: He is not in acute distress. Neck:     Vascular: No JVD.  Cardiovascular:     Rate and Rhythm: Normal rate and regular rhythm.     Heart sounds: Normal heart sounds. No murmur heard. Pulmonary:     Effort: Pulmonary effort is normal.      Breath sounds: Normal breath sounds. No wheezing or rales.  Musculoskeletal:     Right lower leg: No edema.     Left lower leg: No edema.             Visit diagnoses:   ICD-10-CM   1. Essential hypertension  I10 EKG 12-Lead    2. Mixed hyperlipidemia  E78.2     3. Elevated coronary artery calcium score  R93.1        Orders Placed This Encounter  Procedures   EKG 12-Lead    Assessment & Recommendations:   57 y.o. Caucasian old male with hypertension, hyperlipidemia, elevated coronary calcium score h/o stroke  Hypertension: Well-controlled  Mixed hyperlipidemia: LDL 101 in 09/2022.  Currently on Lipitor 40 mg daily. He had lipid panel with PCP today, results not available to me.  If LDL >70, I consider increasing Lipitor to 80 mg daily.  H/o stroke: Tolerating Plavix 75 mg daily, continue the same.  I will see him as needed.      Elder Negus, MD Pager: [phone] Office: [phone]

## 2023-03-24 ENCOUNTER — Encounter: Payer: Self-pay | Admitting: Cardiology

## 2023-04-21 ENCOUNTER — Telehealth: Payer: Self-pay | Admitting: Cardiology

## 2023-04-21 DIAGNOSIS — E782 Mixed hyperlipidemia: Secondary | ICD-10-CM

## 2023-04-21 MED ORDER — ATORVASTATIN CALCIUM 80 MG PO TABS: 80.0000 mg | ORAL_TABLET | Freq: Every day | ORAL | 3 refills | Status: AC

## 2023-04-21 NOTE — Telephone Encounter (Signed)
  LDL is 99. With goal LDL <70, recommend increasing Lipitor to 80 mg daily. If okay with you, we can send 80 mg prescription.   Regards, Dr. Rosemary Holms   Left message for patient to call back.

## 2023-04-21 NOTE — Telephone Encounter (Signed)
Pt c/o medication issue:  1. Name of Medication: atorvastatin (LIPITOR) 40 MG tablet   2. How are you currently taking this medication (dosage and times per day)?  Take 40 mg by mouth daily.       3. Are you having a reaction (difficulty breathing--STAT)? No  4. What is your medication issue? Pt stated he's requesting a callback regarding him getting a message from MD regarding increasing this medication to 80mg . Pt wanted to let him know that he's ok with the increase and the new prescription being called in. Please advise

## 2023-04-21 NOTE — Telephone Encounter (Signed)
Spoke with patient he is aware of lab results and provider recommendations. He verbalized understanding. New Rx sent to [facility] per patient request

## 2023-04-21 NOTE — Telephone Encounter (Signed)
Patient returned RN Pam's call.

## 2023-04-22 NOTE — Telephone Encounter (Signed)
Patient is aware of lab results and increased lipitor.

## 2023-04-22 NOTE — Telephone Encounter (Signed)
Patient states he was told to call back today. Please advise
